# Patient Record
Sex: Male | Born: 2000
Health system: Southern US, Community
[De-identification: ages and names within clinical notes are randomized; demographics above are authoritative.]

## PROBLEM LIST (undated history)

## (undated) DIAGNOSIS — K219 Gastro-esophageal reflux disease without esophagitis: Secondary | ICD-10-CM

## (undated) DIAGNOSIS — F909 Attention-deficit hyperactivity disorder, unspecified type: Secondary | ICD-10-CM

## (undated) DIAGNOSIS — J45909 Unspecified asthma, uncomplicated: Secondary | ICD-10-CM

## (undated) HISTORY — DX: Unspecified asthma, uncomplicated: J45.909

## (undated) HISTORY — DX: Gastro-esophageal reflux disease without esophagitis: K21.9

## (undated) HISTORY — DX: Attention-deficit hyperactivity disorder, unspecified type: F90.9

---
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
## 2018-01-08 ENCOUNTER — Telehealth: Payer: Self-pay | Admitting: *Deleted

## 2018-01-08 NOTE — Telephone Encounter (Signed)
Copied from [place] [id number]. Topic: General - Other >> Jan 08, 2018  2:44 PM Keene Breath wrote: Reason for CRM: Patient called to see if he could possibly be worked in for an appointment to review his medications, VYVANSE 70 MG capsule.  He will be all out in a few days and he really needs to get an appointment before he runs out.  Please advise.  CB# [phone].

## 2018-01-08 NOTE — Telephone Encounter (Signed)
I do not see any openings on the schedule this week

## 2018-01-08 NOTE — Telephone Encounter (Signed)
He can be placed in 4:30 slot next week.

## 2018-01-09 NOTE — Telephone Encounter (Signed)
Patient is scheduled   

## 2018-01-15 ENCOUNTER — Ambulatory Visit (INDEPENDENT_AMBULATORY_CARE_PROVIDER_SITE_OTHER): Payer: BLUE CROSS/BLUE SHIELD

## 2018-01-15 ENCOUNTER — Ambulatory Visit: Payer: BLUE CROSS/BLUE SHIELD | Admitting: Family Medicine

## 2018-01-15 ENCOUNTER — Encounter: Payer: Self-pay | Admitting: Family Medicine

## 2018-01-15 VITALS — BP 122/76 | HR 94 | Temp 98.3°F | Wt 194.8 lb

## 2018-01-15 DIAGNOSIS — R053 Chronic cough: Secondary | ICD-10-CM

## 2018-01-15 DIAGNOSIS — F909 Attention-deficit hyperactivity disorder, unspecified type: Secondary | ICD-10-CM

## 2018-01-15 DIAGNOSIS — F419 Anxiety disorder, unspecified: Secondary | ICD-10-CM

## 2018-01-15 DIAGNOSIS — R21 Rash and other nonspecific skin eruption: Secondary | ICD-10-CM | POA: Diagnosis not present

## 2018-01-15 DIAGNOSIS — T300 Burn of unspecified body region, unspecified degree: Secondary | ICD-10-CM

## 2018-01-15 DIAGNOSIS — R05 Cough: Secondary | ICD-10-CM

## 2018-01-15 DIAGNOSIS — S39012A Strain of muscle, fascia and tendon of lower back, initial encounter: Secondary | ICD-10-CM | POA: Diagnosis not present

## 2018-01-15 MED ORDER — VYVANSE 70 MG PO CAPS: 70.0000 mg | ORAL_CAPSULE | Freq: Every day | ORAL | 0 refills | Status: DC

## 2018-01-15 MED ORDER — TRIAMCINOLONE ACETONIDE 0.1 % EX CREA: 1.0000 "application " | TOPICAL_CREAM | Freq: Two times a day (BID) | CUTANEOUS | 0 refills | Status: DC

## 2018-01-15 NOTE — Patient Instructions (Addendum)
Nice to see you. Please try the triamcinolone for the rash. Please do the back exercises for your back. We will contact you with the chest x-ray result. You need to wear a mask when dealing with chemicals.  If you inhale chemicals or come in contact with chemicals in the future you need to call poison control.   Back Exercises If you have pain in your back, do these exercises 2-3 times each day or as told by your doctor. When the pain goes away, do the exercises once each day, but repeat the steps more times for each exercise (do more repetitions). If you do not have pain in your back, do these exercises once each day or as told by your doctor. Exercises Single Knee to Chest  Do these steps 3-5 times in a row for each leg: 1. Lie on your back on a firm bed or the floor with your legs stretched out. 2. Bring one knee to your chest. 3. Hold your knee to your chest by grabbing your knee or thigh. 4. Pull on your knee until you feel a gentle stretch in your lower back. 5. Keep doing the stretch for 10-30 seconds. 6. Slowly let go of your leg and straighten it.  Pelvic Tilt  Do these steps 5-10 times in a row: 1. Lie on your back on a firm bed or the floor with your legs stretched out. 2. Bend your knees so they point up to the ceiling. Your feet should be flat on the floor. 3. Tighten your lower belly (abdomen) muscles to press your lower back against the floor. This will make your tailbone point up to the ceiling instead of pointing down to your feet or the floor. 4. Stay in this position for 5-10 seconds while you gently tighten your muscles and breathe evenly.  Cat-Cow  Do these steps until your lower back bends more easily: 1. Get on your hands and knees on a firm surface. Keep your hands under your shoulders, and keep your knees under your hips. You may put padding under your knees. 2. Let your head hang down, and make your tailbone point down to the floor so your lower back is round  like the back of a cat. 3. Stay in this position for 5 seconds. 4. Slowly lift your head and make your tailbone point up to the ceiling so your back hangs low (sags) like the back of a cow. 5. Stay in this position for 5 seconds.  Press-Ups  Do these steps 5-10 times in a row: 1. Lie on your belly (face-down) on the floor. 2. Place your hands near your head, about shoulder-width apart. 3. While you keep your back relaxed and keep your hips on the floor, slowly straighten your arms to raise the top half of your body and lift your shoulders. Do not use your back muscles. To make yourself more comfortable, you may change where you place your hands. 4. Stay in this position for 5 seconds. 5. Slowly return to lying flat on the floor.  Bridges  Do these steps 10 times in a row: 1. Lie on your back on a firm surface. 2. Bend your knees so they point up to the ceiling. Your feet should be flat on the floor. 3. Tighten your butt muscles and lift your butt off of the floor until your waist is almost as high as your knees. If you do not feel the muscles working in your butt and the back of  your thighs, slide your feet 1-2 inches farther away from your butt. 4. Stay in this position for 3-5 seconds. 5. Slowly lower your butt to the floor, and let your butt muscles relax.  If this exercise is too easy, try doing it with your arms crossed over your chest. Belly Crunches  Do these steps 5-10 times in a row: 1. Lie on your back on a firm bed or the floor with your legs stretched out. 2. Bend your knees so they point up to the ceiling. Your feet should be flat on the floor. 3. Cross your arms over your chest. 4. Tip your chin a little bit toward your chest but do not bend your neck. 5. Tighten your belly muscles and slowly raise your chest just enough to lift your shoulder blades a tiny bit off of the floor. 6. Slowly lower your chest and your head to the floor.  Back Lifts Do these steps 5-10  times in a row: 1. Lie on your belly (face-down) with your arms at your sides, and rest your forehead on the floor. 2. Tighten the muscles in your legs and your butt. 3. Slowly lift your chest off of the floor while you keep your hips on the floor. Keep the back of your head in line with the curve in your back. Look at the floor while you do this. 4. Stay in this position for 3-5 seconds. 5. Slowly lower your chest and your face to the floor.  Contact a doctor if:  Your back pain gets a lot worse when you do an exercise.  Your back pain does not lessen 2 hours after you exercise. If you have any of these problems, stop doing the exercises. Do not do them again unless your doctor says it is okay. Get help right away if:  You have sudden, very bad back pain. If this happens, stop doing the exercises. Do not do them again unless your doctor says it is okay. This information is not intended to replace advice given to you by your health care provider. Make sure you discuss any questions you have with your health care provider. Document Released: 07/23/2010 Document Revised: 11/26/2015 Document Reviewed: 08/14/2014 Elsevier Interactive Patient Education  [employer].

## 2018-01-16 DIAGNOSIS — S39012A Strain of muscle, fascia and tendon of lower back, initial encounter: Secondary | ICD-10-CM | POA: Insufficient documentation

## 2018-01-16 DIAGNOSIS — T300 Burn of unspecified body region, unspecified degree: Secondary | ICD-10-CM | POA: Insufficient documentation

## 2018-01-16 DIAGNOSIS — R053 Chronic cough: Secondary | ICD-10-CM | POA: Insufficient documentation

## 2018-01-16 DIAGNOSIS — R05 Cough: Secondary | ICD-10-CM | POA: Insufficient documentation

## 2018-01-16 DIAGNOSIS — F419 Anxiety disorder, unspecified: Secondary | ICD-10-CM | POA: Insufficient documentation

## 2018-01-16 NOTE — Assessment & Plan Note (Signed)
Stable.  Refill Vyvanse.

## 2018-01-16 NOTE — Assessment & Plan Note (Addendum)
I suspect that allergies with postnasal drip are contributing.  Vaping is also likely contributing.  We will trial Flonase.  Will obtain a chest x-ray given possible inhalation exposure.  I advised him to wear a mask when he is around any kind of chemicals that he might inhale.  I advised that he contact poison control if he inhales any chemicals.  I advised to quit vaping.  If not improving would refer to pulmonology.

## 2018-01-16 NOTE — Assessment & Plan Note (Signed)
Offered referral for therapy though patient declined.  He opted to monitor his symptoms.

## 2018-01-16 NOTE — Assessment & Plan Note (Signed)
Nonspecific.  Discussed trial of triamcinolone.  If not improving he will let us know.
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
## 2018-01-16 NOTE — Assessment & Plan Note (Signed)
Muscular strain.  Benign exam.  Neurologically intact.  Given exercises to complete.

## 2018-02-14 ENCOUNTER — Telehealth: Payer: Self-pay | Admitting: Family Medicine

## 2018-02-14 NOTE — Telephone Encounter (Signed)
Copied from [place] [id number]. Topic: Quick Communication - Rx Refill/Question >> Feb 14, 2018 12:36 PM Margot Ables wrote: Medication: vyvanse - pt is out of his medication and stating the pharmacy will not fill until 02/15/18. He said last fill was 01/15/18 for 30 days but there are 31 days in July. He said with the paper RXs he used to get he could fill 3 days early before running out. Pt states pharmacy advised need MD approval to fill early. Please advise.  [facility] [place], [facility] [address] Shari Prows [phone] (Phone) [phone] (Fax)

## 2018-02-14 NOTE — Telephone Encounter (Signed)
Please advise 

## 2018-02-14 NOTE — Telephone Encounter (Signed)
It is fine to fill early. Thanks.

## 2018-02-14 NOTE — Telephone Encounter (Signed)
Notified pharmacist of approval for early fill

## 2018-03-14 ENCOUNTER — Other Ambulatory Visit: Payer: Self-pay | Admitting: Family Medicine

## 2018-03-14 MED ORDER — VYVANSE 70 MG PO CAPS: 70.0000 mg | ORAL_CAPSULE | Freq: Every day | ORAL | 0 refills | Status: DC

## 2018-03-14 NOTE — Telephone Encounter (Signed)
Copied from [place] [id number]. Topic: Quick Communication - See Telephone Encounter >> Mar 14, 2018  2:12 PM Bea Graff, NT wrote: CRM for notification. See Telephone encounter for: 03/14/18. Pt calling and states the pharmacy is unable to fille his vyvanse until 9/15 and he only has 2 pills left. He states he will be leaving town on Friday. Please advise. [facility] STORE [place], [facility] [address] Shari Prows [phone] (Phone) [phone] (Fax)

## 2018-03-14 NOTE — Telephone Encounter (Signed)
Call pt    I spoke with [facility] and no current refills on file  I refilled out of courtesy for patient     Last fill 02/14/18 I looked up patient on [place] Controlled Substances Reporting System and saw no activity that raised concern of inappropriate use.

## 2018-03-14 NOTE — Telephone Encounter (Signed)
Request early refill for vyvanse. Dr.Sonnenberg patient

## 2018-03-15 NOTE — Telephone Encounter (Signed)
Pt given message from [place].

## 2018-03-15 NOTE — Telephone Encounter (Signed)
Left voice mail for patient to call back ok for [facility] to speak to patient    

## 2018-04-04 ENCOUNTER — Encounter: Payer: Self-pay | Admitting: Family Medicine

## 2018-04-04 ENCOUNTER — Ambulatory Visit: Payer: BLUE CROSS/BLUE SHIELD | Admitting: Family Medicine

## 2018-04-04 VITALS — BP 116/74 | HR 88 | Temp 98.4°F | Ht 69.0 in | Wt 204.6 lb

## 2018-04-04 DIAGNOSIS — Z789 Other specified health status: Secondary | ICD-10-CM

## 2018-04-04 DIAGNOSIS — R070 Pain in throat: Secondary | ICD-10-CM | POA: Diagnosis not present

## 2018-04-04 DIAGNOSIS — B370 Candidal stomatitis: Secondary | ICD-10-CM | POA: Diagnosis not present

## 2018-04-04 DIAGNOSIS — J029 Acute pharyngitis, unspecified: Secondary | ICD-10-CM | POA: Diagnosis not present

## 2018-04-04 DIAGNOSIS — R0982 Postnasal drip: Secondary | ICD-10-CM | POA: Diagnosis not present

## 2018-04-04 LAB — POCT RAPID STREP A (OFFICE): Rapid Strep A Screen: NEGATIVE

## 2018-04-04 MED ORDER — NYSTATIN 100000 UNIT/ML MT SUSP: 5.0000 mL | Freq: Four times a day (QID) | OROMUCOSAL | 0 refills | Status: AC

## 2018-04-04 MED ORDER — AMOXICILLIN 500 MG PO CAPS: 500.0000 mg | ORAL_CAPSULE | Freq: Two times a day (BID) | ORAL | 0 refills | Status: DC

## 2018-04-04 MED ORDER — FLUTICASONE PROPIONATE 50 MCG/ACT NA SUSP: 2.0000 | Freq: Every day | NASAL | 6 refills | Status: AC

## 2018-04-04 NOTE — Addendum Note (Signed)
Addended by: Neta Ehlers on: 04/04/2018 10:42 AM   Modules accepted: Orders
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
## 2018-04-04 NOTE — Patient Instructions (Addendum)
Quit Vaping!   Strep Throat Strep throat is an infection of the throat. It is caused by germs. Strep throat spreads from person to person because of coughing, sneezing, or close contact. Follow these instructions at home: Medicines  Take over-the-counter and prescription medicines only as told by your doctor.  Take your antibiotic medicine as told by your doctor. Do not stop taking the medicine even if you feel better.  Have family members who also have a sore throat or fever go to a doctor. Eating and drinking  Do not share food, drinking cups, or personal items.  Try eating soft foods until your sore throat feels better.  Drink enough fluid to keep your pee (urine) clear or pale yellow. General instructions  Rinse your mouth (gargle) with a salt-water mixture 3-4 times per day or as needed. To make a salt-water mixture, stir -1 tsp of salt into 1 cup of warm water.  Make sure that all people in your house wash their hands well.  Rest.  Stay home from school or work until you have been taking antibiotics for 24 hours.  Keep all follow-up visits as told by your doctor. This is important. Contact a doctor if:  Your neck keeps getting bigger.  You get a rash, cough, or earache.  You cough up thick liquid that is green, yellow-brown, or bloody.  You have pain that does not get better with medicine.  Your problems get worse instead of getting better.  You have a fever. Get help right away if:  You throw up (vomit).  You get a very bad headache.  You neck hurts or it feels stiff.  You have chest pain or you are short of breath.  You have drooling, very bad throat pain, or changes in your voice.  Your neck is swollen or the skin gets red and tender.  Your mouth is dry or you are peeing less than normal.  You keep feeling more tired or it is hard to wake up.  Your joints are red or they hurt. This information is not intended to replace advice given to you by  your health care provider. Make sure you discuss any questions you have with your health care provider. Document Released: 12/07/2007 Document Revised: 02/17/2016 Document Reviewed: 10/13/2014 Elsevier Interactive Patient Education  [employer].

## 2018-04-06 LAB — UPPER RESPIRATORY CULTURE, ROUTINE

## 2018-04-30 ENCOUNTER — Ambulatory Visit: Payer: BLUE CROSS/BLUE SHIELD | Admitting: Family Medicine

## 2018-04-30 ENCOUNTER — Encounter: Payer: Self-pay | Admitting: Family Medicine

## 2018-04-30 VITALS — BP 118/72 | HR 97 | Temp 97.9°F | Ht 69.0 in | Wt 208.0 lb

## 2018-04-30 DIAGNOSIS — J309 Allergic rhinitis, unspecified: Secondary | ICD-10-CM

## 2018-04-30 DIAGNOSIS — F909 Attention-deficit hyperactivity disorder, unspecified type: Secondary | ICD-10-CM | POA: Diagnosis not present

## 2018-04-30 DIAGNOSIS — Z114 Encounter for screening for human immunodeficiency virus [HIV]: Secondary | ICD-10-CM | POA: Diagnosis not present

## 2018-04-30 DIAGNOSIS — Z23 Encounter for immunization: Secondary | ICD-10-CM | POA: Diagnosis not present

## 2018-04-30 DIAGNOSIS — K219 Gastro-esophageal reflux disease without esophagitis: Secondary | ICD-10-CM | POA: Insufficient documentation

## 2018-04-30 DIAGNOSIS — J029 Acute pharyngitis, unspecified: Secondary | ICD-10-CM | POA: Insufficient documentation

## 2018-04-30 DIAGNOSIS — B370 Candidal stomatitis: Secondary | ICD-10-CM

## 2018-04-30 MED ORDER — LISDEXAMFETAMINE DIMESYLATE 70 MG PO CAPS: 70.0000 mg | ORAL_CAPSULE | Freq: Every day | ORAL | 0 refills | Status: DC

## 2018-04-30 MED ORDER — NYSTATIN 100000 UNIT/ML MT SUSP: 5.0000 mL | Freq: Four times a day (QID) | OROMUCOSAL | 0 refills | Status: DC

## 2018-04-30 MED ORDER — VYVANSE 70 MG PO CAPS: 70.0000 mg | ORAL_CAPSULE | Freq: Every day | ORAL | 0 refills | Status: DC

## 2018-04-30 MED ORDER — OMEPRAZOLE 20 MG PO CPDR: 20.0000 mg | DELAYED_RELEASE_CAPSULE | ORAL | 1 refills | Status: DC

## 2018-04-30 NOTE — Assessment & Plan Note (Signed)
Continue current regimen

## 2018-04-30 NOTE — Assessment & Plan Note (Signed)
Continue every other day prilosec.

## 2018-04-30 NOTE — Patient Instructions (Signed)
Nice to see you. Please try the nystatin.  We will check lab work today and contact you with the results.  I will see you back in several weeks for recheck.

## 2018-04-30 NOTE — Assessment & Plan Note (Signed)
Continued Vyvanse.  Discussed appropriately taking this in the morning.

## 2018-04-30 NOTE — Progress Notes (Signed)
[name], MD Phone: [phone]  Cory Harding is a 17 y.o. male who presents today for follow-up.  CC: ADHD, strep throat, thrush, postnasal drip, reflux  ADHD: Taking Vyvanse daily.  If he takes it around noon he has trouble sleeping.  No appetite suppression or palpitations.  He was treated for strep throat and thrush recently.  Strep culture was positive.  He shows me a picture today of erythematous soft palate with white dots.  He notes his tongue was covered in white spots as well.  He completed amoxicillin and nystatin.  He notes his symptoms have improved.  He notes no prior antibiotic exposure or steroid exposure.  No inhaler use though he does vape.  He has decreased his vaping.  He does note his girlfriend had a yeast infection which may have contributed to the thrush.  No penile discharge or dysuria.  He does report postnasal drip with season changes.  He uses Zyrtec for that with some benefit.  He has decreased his vaping.  Notes taking omeprazole every other day.  This helps control his reflux.  He has no burning or sour taste.  No blood in his stool.  Social History   Tobacco Use  Smoking Status Never Smoker  Smokeless Tobacco Never Used     ROS see history of present illness  Objective  Physical Exam Vitals:   04/30/18 0952  BP: 118/72  Pulse: 97  Temp: 97.9 F (36.6 C)  SpO2: 98%    BP Readings from Last 3 Encounters:  04/30/18 118/72  04/04/18 116/74  01/15/18 122/76   Wt Readings from Last 3 Encounters:  04/30/18 208 lb (94.3 kg) (95 %, Z= 1.64)*  04/04/18 204 lb 9.6 oz (92.8 kg) (94 %, Z= 1.57)*  01/15/18 194 lb 12.8 oz (88.4 kg) (91 %, Z= 1.36)*   * Growth percentiles are based on CDC (Boys, 2-20 Years) data.    Physical Exam  Constitutional: No distress.  HENT:  Head: Normocephalic and atraumatic.  Erythematous soft palate with no exudate, normal tonsils with no exudate, normal posterior oropharynx  Cardiovascular: Normal rate,  regular rhythm and normal heart sounds.  Pulmonary/Chest: Effort normal and breath sounds normal.  Abdominal: Soft. Bowel sounds are normal. He exhibits no distension. There is no tenderness. There is no rebound and no guarding.  Musculoskeletal: He exhibits no edema.  Neurological: He is alert.  Skin: Skin is warm and dry. He is not diaphoretic.     Assessment/Plan: Please see individual problem list.  Attention deficit hyperactivity disorder (ADHD) Continued Vyvanse.  Discussed appropriately taking this in the morning.  Thrush Question whether or not this was thrush versus strep throat.  It has improved though he does still have some erythema in his soft palate.  We will retreat with nystatin.  He is not having any symptoms to indicate strep throat.  We will screen for HIV though his thrush is likely related to his vaping use.  Encouraged cessation of vaping.  GERD (gastroesophageal reflux disease) Continue every other day prilosec.   Allergic rhinitis Continue current regimen.   Health Maintenance: HIV screening today.  Orders Placed This Encounter  Procedures  . Flu Vaccine QUAD 6+ mos PF IM (Fluarix Quad PF)  . HIV antibody (with reflex)    Meds ordered this encounter  Medications  . nystatin (MYCOSTATIN) 100000 UNIT/ML suspension    Sig: Take 5 mLs (500,000 Units total) by mouth 4 (four) times daily.    Dispense:  60 mL  Refill:  0  . omeprazole (PRILOSEC) 20 MG capsule    Sig: Take 1 capsule (20 mg total) by mouth every other day.    Dispense:  45 capsule    Refill:  1  . VYVANSE 70 MG capsule    Sig: Take 1 capsule (70 mg total) by mouth daily.    Dispense:  30 capsule    Refill:  0  . lisdexamfetamine (VYVANSE) 70 MG capsule    Sig: Take 1 capsule (70 mg total) by mouth daily.    Dispense:  30 capsule    Refill:  0  . lisdexamfetamine (VYVANSE) 70 MG capsule    Sig: Take 1 capsule (70 mg total) by mouth daily.    Dispense:  30 capsule    Refill:  0      [name], MD [place]

## 2018-04-30 NOTE — Assessment & Plan Note (Addendum)
Question whether or not this was thrush versus strep throat.  It has improved though he does still have some erythema in his soft palate.  We will retreat with nystatin.  He is not having any symptoms to indicate strep throat.  We will screen for HIV though his thrush is likely related to his vaping use.  Encouraged cessation of vaping.

## 2018-05-01 ENCOUNTER — Telehealth: Payer: Self-pay | Admitting: Family Medicine

## 2018-05-01 LAB — HIV ANTIBODY (ROUTINE TESTING W REFLEX): HIV: NONREACTIVE

## 2018-05-01 NOTE — Telephone Encounter (Signed)
Copied from [place] [id number]. Topic: Quick Communication - Lab Results (Clinic Use ONLY) >> May 01, 2018  3:23 PM Scherrie Gerlach wrote: Pt calling back for lab results from yesterday

## 2018-05-01 NOTE — Telephone Encounter (Signed)
Called and spoke with pt. Pt has been advised of his lab results.

## 2018-05-14 ENCOUNTER — Telehealth: Payer: Self-pay

## 2018-05-14 ENCOUNTER — Ambulatory Visit: Payer: BLUE CROSS/BLUE SHIELD | Admitting: Family Medicine

## 2018-05-14 NOTE — Telephone Encounter (Signed)
Copied from [place] [id number]. Topic: Appointment Scheduling - Scheduling Inquiry for Clinic >> May 14, 2018 11:31 AM Bea Graff, NT wrote: Reason for CRM: Pt cannot come in today due to another appt. HE would like to see if he can be worked in with someone else this week? Please advise.

## 2018-05-14 NOTE — Telephone Encounter (Signed)
Okay to schedule at 3:15 on Wednesday.

## 2018-05-14 NOTE — Telephone Encounter (Signed)
Sent to PCP please advise if we can see patient sooner.   Thanks   Last OV was in October for ADHD and thrush

## 2018-05-15 NOTE — Telephone Encounter (Signed)
Called patient and left a detailed VM for patient to call me back directly at [phone].

## 2018-05-16 NOTE — Telephone Encounter (Signed)
Called patient and left a VM to call back.  

## 2018-05-17 NOTE — Telephone Encounter (Signed)
Could not get in touch with patient and since Wednesday has past when would you like to see patient?

## 2018-05-17 NOTE — Telephone Encounter (Signed)
Someone else has been scheduled for this appt already

## 2018-05-17 NOTE — Telephone Encounter (Signed)
315 on 05/18/2018 would be okay.

## 2018-05-17 NOTE — Telephone Encounter (Signed)
1 day next week at 430 would be okay.

## 2018-05-18 ENCOUNTER — Telehealth: Payer: Self-pay

## 2018-05-18 NOTE — Telephone Encounter (Signed)
Patient scheduled for Monday at 430.

## 2018-05-18 NOTE — Telephone Encounter (Signed)
Copied from [place] [id number]. Topic: Appointment Scheduling - Scheduling Inquiry for Clinic >> May 18, 2018  2:06 PM Burchel, Abbi R wrote: Pt needs f/up for thrush.  Please call pt to sched, [phone].

## 2018-05-21 ENCOUNTER — Ambulatory Visit: Payer: BLUE CROSS/BLUE SHIELD | Admitting: Family Medicine

## 2018-05-21 ENCOUNTER — Encounter: Payer: Self-pay | Admitting: Family Medicine

## 2018-05-21 DIAGNOSIS — J029 Acute pharyngitis, unspecified: Secondary | ICD-10-CM | POA: Diagnosis not present

## 2018-05-21 DIAGNOSIS — F909 Attention-deficit hyperactivity disorder, unspecified type: Secondary | ICD-10-CM | POA: Diagnosis not present

## 2018-05-21 DIAGNOSIS — F419 Anxiety disorder, unspecified: Secondary | ICD-10-CM

## 2018-05-21 MED ORDER — BUSPIRONE HCL 5 MG PO TABS: 5.0000 mg | ORAL_TABLET | Freq: Two times a day (BID) | ORAL | 2 refills | Status: DC

## 2018-05-21 NOTE — Progress Notes (Signed)
  [name], MD Phone: [phone]  Cory Harding is a 17 y.o. male who presents today for follow-up.  CC: Sore throat, ADHD, anxiety  Sore throat: Patient previously seen for possible thrush.  He notes he started to feel sick with an upper respiratory illness shortly after seeing me.  Notes he thinks it was related to that.  His throat got a little irritated 2 days ago though he feels better now.  He is not vaping as much.  ADHD: Taking Vyvanse.  No palpitations or appetite suppression.  He does note some sleep difficulty though that seems to be related to the anxiety.  He takes his Vyvanse at 8 AM.  Anxiety: Patient notes there are times at night where he cannot get his mind to shut off.  He does feel anxious about things.  This has been an ongoing issue.  He has an interview tonight and is fairly anxious about it.  He does take a pre-workout mix that has 300 mg of caffeine in it.  He works out at night.  Social History   Tobacco Use  Smoking Status Never Smoker  Smokeless Tobacco Never Used     ROS see history of present illness  Objective  Physical Exam Vitals:   05/21/18 1632  BP: 138/90  Pulse: 72  Temp: 98.9 F (37.2 C)  SpO2: 96%    BP Readings from Last 3 Encounters:  05/21/18 138/90  04/30/18 118/72  04/04/18 116/74   Wt Readings from Last 3 Encounters:  05/21/18 205 lb 3.2 oz (93.1 kg) (94 %, Z= 1.58)*  04/30/18 208 lb (94.3 kg) (95 %, Z= 1.64)*  04/04/18 204 lb 9.6 oz (92.8 kg) (94 %, Z= 1.57)*   * Growth percentiles are based on CDC (Boys, 2-20 Years) data.    Physical Exam  Constitutional: No distress.  HENT:  Minimal posterior oropharynx erythema, no exudate  Cardiovascular: Normal rate, regular rhythm and normal heart sounds.  Pulmonary/Chest: Effort normal and breath sounds normal.  Musculoskeletal: He exhibits no edema.  Neurological: He is alert.  Skin: Skin is warm and dry. He is not diaphoretic.     Assessment/Plan: Please see  individual problem list.  Sore throat I suspect this is related to an upper respiratory infection.  He notes his symptoms have progressively improved.  He will monitor.  He will continue to work on quitting vaping.  Anxiety Patient with persistent anxiety symptoms.  Discussed options for medication treatment versus therapy referral.  Patient opted for medication management.  He wanted to avoid SSRIs given potential weight gain.  We opted for BuSpar.  He will follow-up in 3 months.  Attention deficit hyperactivity disorder (ADHD) Adequately controlled.  I suspect his sleep issues are more related to his anxiety.  He is going to be potentially starting a new job where he works 7 PM to 6 AM.  Discussed taking his Vyvanse to coincide with when he will be working.   No orders of the defined types were placed in this encounter.   Meds ordered this encounter  Medications  . busPIRone (BUSPAR) 5 MG tablet    Sig: Take 1 tablet (5 mg total) by mouth 2 (two) times daily.    Dispense:  60 tablet    Refill:  2     [name], MD [place]

## 2018-05-21 NOTE — Assessment & Plan Note (Signed)
Adequately controlled.  I suspect his sleep issues are more related to his anxiety.  He is going to be potentially starting a new job where he works 7 PM to 6 AM.  Discussed taking his Vyvanse to coincide with when he will be working.

## 2018-05-21 NOTE — Patient Instructions (Signed)
Nice to see you. We will place you on BuSpar for anxiety. Please switch your Vyvanse to evening if you get the overnight job.

## 2018-05-21 NOTE — Telephone Encounter (Signed)
Pt has been scheduled for an appt today.

## 2018-05-21 NOTE — Assessment & Plan Note (Signed)
Patient with persistent anxiety symptoms.  Discussed options for medication treatment versus therapy referral.  Patient opted for medication management.  He wanted to avoid SSRIs given potential weight gain.  We opted for BuSpar.  He will follow-up in 3 months.

## 2018-05-21 NOTE — Assessment & Plan Note (Signed)
I suspect this is related to an upper respiratory infection.  He notes his symptoms have progressively improved.  He will monitor.  He will continue to work on quitting vaping.

## 2018-06-25 ENCOUNTER — Telehealth: Payer: Self-pay | Admitting: *Deleted

## 2018-06-25 ENCOUNTER — Other Ambulatory Visit: Payer: Self-pay | Admitting: Internal Medicine

## 2018-06-25 DIAGNOSIS — F909 Attention-deficit hyperactivity disorder, unspecified type: Secondary | ICD-10-CM

## 2018-06-25 MED ORDER — LISDEXAMFETAMINE DIMESYLATE 70 MG PO CAPS: 70.0000 mg | ORAL_CAPSULE | Freq: Every day | ORAL | 0 refills | Status: DC

## 2018-06-25 NOTE — Telephone Encounter (Signed)
Copied from [place] [phone]. Topic: General - Other >> Jun 25, 2018 11:15 AM Yvette Rack wrote: Reason for CRM: Pt mother stated pt went to pick up Rx from [facility] in [place] but they do not have the VYVANSE 70 MG capsule. Pt mother requests that a new Rx for VYVANSE 70 MG capsule be sent to [id number] Lorina Rabon, [place]  [phone] (Phone)  [phone] (Fax)

## 2018-06-25 NOTE — Telephone Encounter (Signed)
Called [facility] in [place] and the do NOT have the 70 MG capsule of Vyvanse in stock. They suggested to cancel the Rx for December and the will keep the Rx for January on HOLD.   Sent to [facility] covering provider for PCP due to being out of the office. Please resent Vyvanse 70 MG 1 month supple for December to [facility] in [place].   Thanks

## 2018-06-25 NOTE — Telephone Encounter (Signed)
Sent 1 month supply will have PCP do 07/2018 when returns  [place]

## 2018-07-31 ENCOUNTER — Encounter: Payer: Self-pay | Admitting: Family Medicine

## 2018-07-31 ENCOUNTER — Ambulatory Visit: Payer: BLUE CROSS/BLUE SHIELD | Admitting: Family Medicine

## 2018-07-31 DIAGNOSIS — K219 Gastro-esophageal reflux disease without esophagitis: Secondary | ICD-10-CM

## 2018-07-31 DIAGNOSIS — F909 Attention-deficit hyperactivity disorder, unspecified type: Secondary | ICD-10-CM

## 2018-07-31 DIAGNOSIS — F419 Anxiety disorder, unspecified: Secondary | ICD-10-CM | POA: Diagnosis not present

## 2018-07-31 MED ORDER — LISDEXAMFETAMINE DIMESYLATE 70 MG PO CAPS: 70.0000 mg | ORAL_CAPSULE | Freq: Every day | ORAL | 0 refills | Status: DC

## 2018-07-31 MED ORDER — BUSPIRONE HCL 5 MG PO TABS: 5.0000 mg | ORAL_TABLET | Freq: Two times a day (BID) | ORAL | 2 refills | Status: DC

## 2018-07-31 NOTE — Assessment & Plan Note (Signed)
Well-controlled.  Continue Vyvanse.

## 2018-07-31 NOTE — Assessment & Plan Note (Signed)
Continue Prilosec.  Refer to GI given persistent symptoms.

## 2018-07-31 NOTE — Progress Notes (Signed)
  [name], MD Phone: [phone]  Cory Harding is a 18 y.o. male who presents today for follow-up.  CC: ADHD, anxiety  ADHD: Taking Vyvanse.  He is taking it right before going to work at 4:30 PM.  He notes no palpitations, appetite suppression, or sleep difficulty.  Notes that Vyvanse is effective.  Anxiety: He notes no anxiety or depression.  The BuSpar has been quite beneficial.  GERD: He continues on omeprazole.  Notes if he misses a dose he will have fairly significant symptoms of reflux with burning.  He notes no abdominal pain, blood in stool, or dysphagia.  Social History   Tobacco Use  Smoking Status Never Smoker  Smokeless Tobacco Never Used     ROS see history of present illness  Objective  Physical Exam Vitals:   07/31/18 1440  BP: 102/78  Pulse: 86  Temp: 97.8 F (36.6 C)  SpO2: 97%    BP Readings from Last 3 Encounters:  07/31/18 102/78  05/21/18 138/90  04/30/18 118/72   Wt Readings from Last 3 Encounters:  07/31/18 196 lb 9.6 oz (89.2 kg) (91 %, Z= 1.35)*  05/21/18 205 lb 3.2 oz (93.1 kg) (94 %, Z= 1.58)*  04/30/18 208 lb (94.3 kg) (95 %, Z= 1.64)*   * Growth percentiles are based on CDC (Boys, 2-20 Years) data.    Physical Exam Constitutional:      General: He is not in acute distress.    Appearance: He is not diaphoretic.  Cardiovascular:     Rate and Rhythm: Normal rate and regular rhythm.     Heart sounds: Normal heart sounds.  Pulmonary:     Effort: Pulmonary effort is normal.     Breath sounds: Normal breath sounds.  Musculoskeletal:     Right lower leg: No edema.     Left lower leg: No edema.  Skin:    General: Skin is warm and dry.  Neurological:     Mental Status: He is alert.      Assessment/Plan: Please see individual problem list.  GERD (gastroesophageal reflux disease) Continue Prilosec.  Refer to GI given persistent symptoms.  Anxiety Improved.  Continue current regimen.  Attention deficit  hyperactivity disorder (ADHD) Well-controlled.  Continue Vyvanse.   Orders Placed This Encounter  Procedures  . Ambulatory referral to Gastroenterology    Referral Priority:   Routine    Referral Type:   Consultation    Referral Reason:   Specialty Services Required    Number of Visits Requested:   1    Meds ordered this encounter  Medications  . lisdexamfetamine (VYVANSE) 70 MG capsule    Sig: Take 1 capsule (70 mg total) by mouth daily.    Dispense:  30 capsule    Refill:  0  . lisdexamfetamine (VYVANSE) 70 MG capsule    Sig: Take 1 capsule (70 mg total) by mouth daily.    Dispense:  30 capsule    Refill:  0  . lisdexamfetamine (VYVANSE) 70 MG capsule    Sig: Take 1 capsule (70 mg total) by mouth daily.    Dispense:  30 capsule    Refill:  0  . busPIRone (BUSPAR) 5 MG tablet    Sig: Take 1 tablet (5 mg total) by mouth 2 (two) times daily.    Dispense:  180 tablet    Refill:  2     [name], MD [place]

## 2018-07-31 NOTE — Assessment & Plan Note (Signed)
Improved.  Continue current regimen

## 2018-07-31 NOTE — Patient Instructions (Signed)
Nice to see you. Good luck with work. I have refilled your medications.

## 2018-08-06 ENCOUNTER — Encounter: Payer: Self-pay | Admitting: *Deleted

## 2018-08-23 ENCOUNTER — Encounter: Payer: Self-pay | Admitting: Internal Medicine

## 2018-08-23 ENCOUNTER — Ambulatory Visit: Payer: BLUE CROSS/BLUE SHIELD | Admitting: Internal Medicine

## 2018-08-23 VITALS — BP 112/76 | HR 77 | Temp 97.7°F | Ht 69.0 in | Wt 194.6 lb

## 2018-08-23 DIAGNOSIS — J029 Acute pharyngitis, unspecified: Secondary | ICD-10-CM | POA: Diagnosis not present

## 2018-08-23 LAB — POCT RAPID STREP A (OFFICE): Rapid Strep A Screen: NEGATIVE

## 2018-08-23 MED ORDER — CEFTRIAXONE SODIUM 250 MG IJ SOLR
250.0000 mg | Freq: Once | INTRAMUSCULAR | Status: AC
  Administered 2018-08-23: 250 mg via INTRAMUSCULAR

## 2018-08-23 MED ORDER — AZITHROMYCIN 250 MG PO TABS: ORAL_TABLET | ORAL | 0 refills | Status: DC

## 2018-08-23 NOTE — Progress Notes (Signed)
Pre visit review using our clinic review tool, if applicable. No additional management support is needed unless otherwise documented below in the visit note. 

## 2018-08-23 NOTE — Progress Notes (Signed)
Chief Complaint  Patient presents with  . Sore Throat   Sore throat rapid strep negative today, sore throat and fatigue x 2 days white exudate on back of throat yesterday nothing tried, no fever no body aches other than working out recently at the gym and sore He has been sexually active with oral sex recently with new partner x 1-2 weeks ago    Review of Systems  Constitutional: Negative for fever.  HENT: Positive for sore throat.   Respiratory: Negative for cough.   Cardiovascular: Negative for chest pain.  Skin: Negative for rash.   Past Medical History:  Diagnosis Date  . ADHD (attention deficit hyperactivity disorder)   . Asthma   . GERD (gastroesophageal reflux disease)    No past surgical history on file. Family History  Problem Relation Age of Onset  . Hypertension Maternal Grandfather   . Heart disease Maternal Grandfather   . Heart disease Paternal Grandfather   . Sudden death Paternal Grandfather   . Mental illness Paternal Grandfather   . Alcoholism Paternal Grandfather    Social History   Socioeconomic History  . Marital status: Single    Spouse name: Not on file  . Number of children: Not on file  . Years of education: Not on file  . Highest education level: Not on file  Occupational History  . Occupation: Ship broker  Social Needs  . Financial resource strain: Not on file  . Food insecurity:    Worry: Not on file    Inability: Not on file  . Transportation needs:    Medical: Not on file    Non-medical: Not on file  Tobacco Use  . Smoking status: Never Smoker  . Smokeless tobacco: Never Used  Substance and Sexual Activity  . Alcohol use: No    Alcohol/week: 0.0 standard drinks  . Drug use: No  . Sexual activity: Not on file  Lifestyle  . Physical activity:    Days per week: Not on file    Minutes per session: Not on file  . Stress: Not on file  Relationships  . Social connections:    Talks on phone: Not on file    Gets together: Not on file     Attends religious service: Not on file    Active member of club or organization: Not on file    Attends meetings of clubs or organizations: Not on file    Relationship status: Not on file  . Intimate partner violence:    Fear of current or ex partner: Not on file    Emotionally abused: Not on file    Physically abused: Not on file    Forced sexual activity: Not on file  Other Topics Concern  . Not on file  Social History Narrative  . Not on file   Current Meds  Medication Sig  . busPIRone (BUSPAR) 5 MG tablet Take 1 tablet (5 mg total) by mouth 2 (two) times daily.  . cetirizine (ZYRTEC) 10 MG tablet Take 10 mg by mouth daily.  . fluticasone (FLONASE) 50 MCG/ACT nasal spray Place 2 sprays into both nostrils daily.  Derrill Memo ON 09/29/2018] lisdexamfetamine (VYVANSE) 70 MG capsule Take 1 capsule (70 mg total) by mouth daily.  Derrill Memo ON 08/31/2018] lisdexamfetamine (VYVANSE) 70 MG capsule Take 1 capsule (70 mg total) by mouth daily.  Marland Kitchen lisdexamfetamine (VYVANSE) 70 MG capsule Take 1 capsule (70 mg total) by mouth daily.  Marland Kitchen nystatin (MYCOSTATIN) 100000 UNIT/ML suspension Take 5 mLs (500,000  Units total) by mouth 4 (four) times daily.  Marland Kitchen omeprazole (PRILOSEC) 20 MG capsule Take 1 capsule (20 mg total) by mouth every other day.   No Known Allergies No results found for this or any previous visit (from the past 2160 hour(s)). Objective  Body mass index is 28.74 kg/m. Wt Readings from Last 3 Encounters:  08/23/18 194 lb 9.6 oz (88.3 kg) (90 %, Z= 1.30)*  07/31/18 196 lb 9.6 oz (89.2 kg) (91 %, Z= 1.35)*  05/21/18 205 lb 3.2 oz (93.1 kg) (94 %, Z= 1.58)*   * Growth percentiles are based on CDC (Boys, 2-20 Years) data.   Temp Readings from Last 3 Encounters:  08/23/18 97.7 F (36.5 C) (Oral)  07/31/18 97.8 F (36.6 C) (Oral)  05/21/18 98.9 F (37.2 C) (Oral)   BP Readings from Last 3 Encounters:  08/23/18 112/76  07/31/18 102/78  05/21/18 138/90   Pulse Readings from Last  3 Encounters:  08/23/18 77  07/31/18 86  05/21/18 72    Physical Exam Vitals signs and nursing note reviewed.  Constitutional:      Appearance: Normal appearance. He is well-developed and well-groomed.  HENT:     Head: Normocephalic and atraumatic.     Mouth/Throat:     Mouth: Mucous membranes are moist.     Pharynx: Oropharyngeal exudate and posterior oropharyngeal erythema present.     Tonsils: Tonsillar exudate present. No tonsillar abscesses.     Comments: Exudate on tonsils  Eyes:     Conjunctiva/sclera: Conjunctivae normal.     Pupils: Pupils are equal, round, and reactive to light.  Cardiovascular:     Rate and Rhythm: Normal rate and regular rhythm.     Heart sounds: Normal heart sounds. No murmur.  Pulmonary:     Effort: Pulmonary effort is normal.     Breath sounds: Normal breath sounds.  Skin:    General: Skin is warm and dry.  Neurological:     General: No focal deficit present.     Mental Status: He is alert and oriented to person, place, and time.     Gait: Gait normal.  Psychiatric:        Attention and Perception: Attention and perception normal.        Mood and Affect: Mood and affect normal.        Speech: Speech normal.        Behavior: Behavior normal. Behavior is cooperative.        Thought Content: Thought content normal.        Cognition and Memory: Cognition and memory normal.        Judgment: Judgment normal.     Assessment   1. Sore throat rapid strep negative r/o GC Plan   2.  Rocephin 250 x 1 zpack 1 g today and 250 mg 1 pill x 4 days day 2-5  Warm salt gargles Cepacol/sucrets/chloroseptic spray  Throat culture for gonorrhea today  Rapid strep negative  If throat continues to bother refer to ENT Use hydrogen peroxide based mouthwash h/o tonsil stones   Provider: Dr. Olivia Mackie McLean-Scocuzza-Internal Medicine

## 2018-08-23 NOTE — Patient Instructions (Signed)
Call back if not better  rec warm salt water gargles and hydrogen peroxide based mouth wash for tonsil stones  If sore throat continues you will need referral to ENT    Sore Throat A sore throat is pain, burning, irritation, or scratchiness in the throat. When you have a sore throat, you may feel pain or tenderness in your throat when you swallow or talk. Many things can cause a sore throat, including:  An infection.  Seasonal allergies.  Dryness in the air.  Irritants, such as smoke or pollution.  Radiation treatment to the area.  Gastroesophageal reflux disease (GERD).  A tumor. A sore throat is often the first sign of another sickness. It may happen with other symptoms, such as coughing, sneezing, fever, and swollen neck glands. Most sore throats go away without medical treatment. Follow these instructions at home:      Take over-the-counter medicines only as told by your health care provider. ? If your child has a sore throat, do not give your child aspirin because of the association with Reye syndrome.  Drink enough fluids to keep your urine pale yellow.  Rest as needed.  To help with pain, try: ? Sipping warm liquids, such as broth, herbal tea, or warm water. ? Eating or drinking cold or frozen liquids, such as frozen ice pops. ? Gargling with a salt-water mixture 3-4 times a day or as needed. To make a salt-water mixture, completely dissolve -1 tsp (3-6 g) of salt in 1 cup (237 mL) of warm water. ? Sucking on hard candy or throat lozenges. ? Putting a cool-mist humidifier in your bedroom at night to moisten the air. ? Sitting in the bathroom with the door closed for 5-10 minutes while you run hot water in the shower.  Do not use any products that contain nicotine or tobacco, such as cigarettes, e-cigarettes, and chewing tobacco. If you need help quitting, ask your health care provider.  Wash your hands well and often with soap and water. If soap and water are not  available, use hand sanitizer. Contact a health care provider if:  You have a fever for more than 2-3 days.  You have symptoms that last (are persistent) for more than 2-3 days.  Your throat does not get better within 7 days.  You have a fever and your symptoms suddenly get worse.  Your child who is 3 months to 74 years old has a temperature of 102.53F (39C) or higher. Get help right away if:  You have difficulty breathing.  You cannot swallow fluids, soft foods, or your saliva.  You have increased swelling in your throat or neck.  You have persistent nausea and vomiting. Summary  A sore throat is pain, burning, irritation, or scratchiness in the throat. Many things can cause a sore throat.  Take over-the-counter medicines only as told by your health care provider. Do not give your child aspirin.  Drink plenty of fluids, and rest as needed.  Contact a health care provider if your symptoms worsen or your sore throat does not get better within 7 days. This information is not intended to replace advice given to you by your health care provider. Make sure you discuss any questions you have with your health care provider. Document Released: 07/28/2004 Document Revised: 11/20/2017 Document Reviewed: 11/20/2017 Elsevier Interactive Patient Education  2019 [employer].

## 2018-08-26 LAB — GC/CHLAMYDIA PROBE, AMP (THROAT)
Chlamydia trachomatis RNA: NOT DETECTED
Neisseria gonorrhoeae RNA: NOT DETECTED

## 2018-08-29 ENCOUNTER — Ambulatory Visit: Payer: BLUE CROSS/BLUE SHIELD | Admitting: Family Medicine

## 2018-08-29 ENCOUNTER — Encounter: Payer: Self-pay | Admitting: Family Medicine

## 2018-08-29 DIAGNOSIS — S161XXA Strain of muscle, fascia and tendon at neck level, initial encounter: Secondary | ICD-10-CM | POA: Diagnosis not present

## 2018-08-29 DIAGNOSIS — M25562 Pain in left knee: Secondary | ICD-10-CM | POA: Diagnosis not present

## 2018-08-29 DIAGNOSIS — S39012A Strain of muscle, fascia and tendon of lower back, initial encounter: Secondary | ICD-10-CM

## 2018-08-29 MED ORDER — CYCLOBENZAPRINE HCL 10 MG PO TABS: 5.0000 mg | ORAL_TABLET | Freq: Every evening | ORAL | 0 refills | Status: DC | PRN
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
## 2018-08-30 ENCOUNTER — Ambulatory Visit: Payer: BLUE CROSS/BLUE SHIELD | Admitting: Family Medicine

## 2018-11-05 ENCOUNTER — Other Ambulatory Visit: Payer: Self-pay

## 2018-11-05 ENCOUNTER — Telehealth: Payer: Self-pay | Admitting: Family Medicine

## 2018-11-05 ENCOUNTER — Encounter: Payer: Self-pay | Admitting: Family Medicine

## 2018-11-05 ENCOUNTER — Ambulatory Visit: Payer: BLUE CROSS/BLUE SHIELD | Admitting: Family Medicine

## 2018-11-05 ENCOUNTER — Ambulatory Visit (INDEPENDENT_AMBULATORY_CARE_PROVIDER_SITE_OTHER): Payer: BLUE CROSS/BLUE SHIELD | Admitting: Family Medicine

## 2018-11-05 DIAGNOSIS — F419 Anxiety disorder, unspecified: Secondary | ICD-10-CM

## 2018-11-05 DIAGNOSIS — F909 Attention-deficit hyperactivity disorder, unspecified type: Secondary | ICD-10-CM | POA: Diagnosis not present

## 2018-11-05 DIAGNOSIS — K219 Gastro-esophageal reflux disease without esophagitis: Secondary | ICD-10-CM | POA: Diagnosis not present

## 2018-11-05 MED ORDER — BUSPIRONE HCL 7.5 MG PO TABS: 7.5000 mg | ORAL_TABLET | Freq: Two times a day (BID) | ORAL | 1 refills | Status: DC

## 2018-11-05 MED ORDER — LISDEXAMFETAMINE DIMESYLATE 70 MG PO CAPS: 70.0000 mg | ORAL_CAPSULE | Freq: Every day | ORAL | 0 refills | Status: DC

## 2018-11-05 NOTE — Assessment & Plan Note (Signed)
Refill of vyvanse sent to pharmacy.

## 2018-11-05 NOTE — Telephone Encounter (Signed)
Called pt and was unable to leave a VM to call back due to mailbox being full.  

## 2018-11-05 NOTE — Telephone Encounter (Signed)
Please call the patient and get him set up for 3-4 month follow-up. Thanks.

## 2018-11-05 NOTE — Assessment & Plan Note (Signed)
Improved though continues to be an issue. We will increase his buspar dose to 7.5 mg BID. He will take 1.5 tablets of his current 5 mg buspar tablet supply and then pick up the new prescription.

## 2018-11-05 NOTE — Telephone Encounter (Signed)
Called and spoke with pt. Pt scheduled for an appt

## 2018-11-05 NOTE — Telephone Encounter (Signed)
02/08/2019 @ 2:45 will do virtual visit if needed

## 2018-11-05 NOTE — Assessment & Plan Note (Signed)
Patient was advised to see GI. He was given the phone number from the letter that was sent to him. He will call to schedule an appointment.

## 2018-11-05 NOTE — Progress Notes (Signed)
Virtual Visit via video Note  This visit type was conducted due to national recommendations for restrictions regarding the COVID-19 pandemic (e.g. social distancing).  This format is felt to be most appropriate for this patient at this time.  All issues noted in this document were discussed and addressed.  No physical exam was performed (except for noted visual exam findings with Video Visits).   I connected with Cory Harding on 11/05/18 at  8:30 AM EDT by a video enabled telemedicine application and verified that I am speaking with the correct person using two identifiers. Location patient: home Location provider: work Persons participating in the virtual visit: patient, provider  I discussed the limitations, risks, security and privacy concerns of performing an evaluation and management service by telephone and the availability of in person appointments. I also discussed with the patient that there may be a patient responsible charge related to this service. The patient expressed understanding and agreed to proceed.  Reason for visit: follow-up  HPI: Anxiety: notes this is a little better on the buspar. He notes no depression. He ran out of the buspar for about 5 days and noted a difference off of this. He notes the anxiety is affecting his sleep. He tries to go to bed around 1 am and does not fall asleep until 3-4 am. He does play xbox until he goes to bed. He does not drink caffeine. He has 1-2 beers per week.   ADHD Medication: vyvanse Effectiveness: yes Palpitations: no Sleep difficulty: see above Appetite suppression: no  GERD:   Reflux symptoms: not when he takes the omeprazole   Abd pain: no   Blood in stool: no  Dysphagia: no   EGD: no history of this  Medication: omeprazole Patient previously referred to GI though he did not schedule an appointment.     ROS: See pertinent positives and negatives per HPI.  Past Medical History:  Diagnosis Date  . ADHD (attention  deficit hyperactivity disorder)   . Asthma   . GERD (gastroesophageal reflux disease)     No past surgical history on file.  Family History  Problem Relation Age of Onset  . Hypertension Maternal Grandfather   . Heart disease Maternal Grandfather   . Heart disease Paternal Grandfather   . Sudden death Paternal Grandfather   . Mental illness Paternal Grandfather   . Alcoholism Paternal Grandfather     SOCIAL HX: Non-smoker   Current Outpatient Medications:  .  busPIRone (BUSPAR) 7.5 MG tablet, Take 1 tablet (7.5 mg total) by mouth 2 (two) times daily., Disp: 180 tablet, Rfl: 1 .  cetirizine (ZYRTEC) 10 MG tablet, Take 10 mg by mouth daily., Disp: , Rfl:  .  cyclobenzaprine (FLEXERIL) 10 MG tablet, Take 0.5-1 tablets (5-10 mg total) by mouth at bedtime as needed for muscle spasms., Disp: 10 tablet, Rfl: 0 .  fluticasone (FLONASE) 50 MCG/ACT nasal spray, Place 2 sprays into both nostrils daily., Disp: 16 g, Rfl: 6 .  [START ON 01/15/2019] lisdexamfetamine (VYVANSE) 70 MG capsule, Take 1 capsule (70 mg total) by mouth daily., Disp: 30 capsule, Rfl: 0 .  [START ON 12/16/2018] lisdexamfetamine (VYVANSE) 70 MG capsule, Take 1 capsule (70 mg total) by mouth daily., Disp: 30 capsule, Rfl: 0 .  [START ON 11/15/2018] lisdexamfetamine (VYVANSE) 70 MG capsule, Take 1 capsule (70 mg total) by mouth daily., Disp: 30 capsule, Rfl: 0 .  omeprazole (PRILOSEC) 20 MG capsule, Take 1 capsule (20 mg total) by mouth every other day., Disp: 45  capsule, Rfl: 1  EXAM:  VITALS per patient if applicable: None.  GENERAL: alert, oriented, appears well and in no acute distress  HEENT: atraumatic, conjunttiva clear, no obvious abnormalities on inspection of external nose and ears  NECK: normal movements of the head and neck  LUNGS: on inspection no signs of respiratory distress, breathing rate appears normal, no obvious gross SOB, gasping or wheezing  CV: no obvious cyanosis  MS: moves all visible  extremities without noticeable abnormality  PSYCH/NEURO: pleasant and cooperative, no obvious depression or anxiety, speech and thought processing grossly intact  ASSESSMENT AND PLAN:  Discussed the following assessment and plan:  Attention deficit hyperactivity disorder (ADHD), unspecified ADHD type - Plan: lisdexamfetamine (VYVANSE) 70 MG capsule  Gastroesophageal reflux disease, esophagitis presence not specified  Anxiety  GERD (gastroesophageal reflux disease) Patient was advised to see GI. He was given the phone number from the letter that was sent to him. He will call to schedule an appointment.   Anxiety Improved though continues to be an issue. We will increase his buspar dose to 7.5 mg BID. He will take 1.5 tablets of his current 5 mg buspar tablet supply and then pick up the new prescription.   Attention deficit hyperactivity disorder (ADHD) Refill of vyvanse sent to pharmacy.   Social distancing precautions and sick precautions given regarding COVID19.    I discussed the assessment and treatment plan with the patient. The patient was provided an opportunity to ask questions and all were answered. The patient agreed with the plan and demonstrated an understanding of the instructions.   The patient was advised to call back or seek an in-person evaluation if the symptoms worsen or if the condition fails to improve as anticipated.   [name], MD

## 2019-02-04 DIAGNOSIS — Z20828 Contact with and (suspected) exposure to other viral communicable diseases: Secondary | ICD-10-CM | POA: Diagnosis not present

## 2019-02-08 ENCOUNTER — Ambulatory Visit (INDEPENDENT_AMBULATORY_CARE_PROVIDER_SITE_OTHER): Payer: BC Managed Care – PPO | Admitting: Family Medicine

## 2019-02-08 ENCOUNTER — Other Ambulatory Visit: Payer: Self-pay

## 2019-02-08 DIAGNOSIS — J309 Allergic rhinitis, unspecified: Secondary | ICD-10-CM

## 2019-02-08 DIAGNOSIS — F419 Anxiety disorder, unspecified: Secondary | ICD-10-CM | POA: Diagnosis not present

## 2019-02-08 DIAGNOSIS — M545 Low back pain, unspecified: Secondary | ICD-10-CM

## 2019-02-08 DIAGNOSIS — Z20828 Contact with and (suspected) exposure to other viral communicable diseases: Secondary | ICD-10-CM | POA: Diagnosis not present

## 2019-02-08 DIAGNOSIS — Z20822 Contact with and (suspected) exposure to covid-19: Secondary | ICD-10-CM

## 2019-02-08 DIAGNOSIS — F909 Attention-deficit hyperactivity disorder, unspecified type: Secondary | ICD-10-CM

## 2019-02-08 DIAGNOSIS — N503 Cyst of epididymis: Secondary | ICD-10-CM
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
## 2019-02-09 DIAGNOSIS — M545 Low back pain, unspecified: Secondary | ICD-10-CM | POA: Insufficient documentation

## 2019-02-09 DIAGNOSIS — Z20822 Contact with and (suspected) exposure to covid-19: Secondary | ICD-10-CM | POA: Insufficient documentation

## 2019-02-09 DIAGNOSIS — Z20828 Contact with and (suspected) exposure to other viral communicable diseases: Secondary | ICD-10-CM | POA: Insufficient documentation

## 2019-02-09 MED ORDER — LISDEXAMFETAMINE DIMESYLATE 70 MG PO CAPS: 70.0000 mg | ORAL_CAPSULE | Freq: Every day | ORAL | 0 refills | Status: DC

## 2019-02-09 NOTE — Assessment & Plan Note (Signed)
He will switch back to his prior allergy medication.  He will monitor his symptoms.

## 2019-02-09 NOTE — Assessment & Plan Note (Signed)
Continues to be present.  He denies any symptoms from this.

## 2019-02-09 NOTE — Assessment & Plan Note (Signed)
Patient was exposed COVID-19 though has had a negative test.  I discussed continuing quarantine for a full 14 days to see if he develops any new symptoms.  He will contact us if he develops any symptoms.

## 2019-02-09 NOTE — Assessment & Plan Note (Signed)
Given persistence he will come in for an x-ray.  We will then determine if PT is warranted.

## 2019-02-09 NOTE — Assessment & Plan Note (Signed)
Continue Vyvanse

## 2019-02-09 NOTE — Assessment & Plan Note (Signed)
Improved.  Continue BuSpar.

## 2019-02-25 ENCOUNTER — Other Ambulatory Visit: Payer: Self-pay

## 2019-02-25 ENCOUNTER — Ambulatory Visit (INDEPENDENT_AMBULATORY_CARE_PROVIDER_SITE_OTHER)
Admission: RE | Admit: 2019-02-25 | Discharge: 2019-02-25 | Disposition: A | Discharge status: Home / Self Care | Payer: BC Managed Care – PPO | Source: Ambulatory Visit | Attending: Family Medicine | Admitting: Family Medicine

## 2019-02-25 DIAGNOSIS — M545 Low back pain, unspecified: Secondary | ICD-10-CM

## 2019-03-05 ENCOUNTER — Telehealth: Payer: Self-pay

## 2019-03-05 ENCOUNTER — Other Ambulatory Visit: Payer: Self-pay | Admitting: Family Medicine

## 2019-03-05 DIAGNOSIS — M545 Low back pain, unspecified: Secondary | ICD-10-CM

## 2019-03-05 MED ORDER — OMEPRAZOLE 20 MG PO CPDR: 20.0000 mg | DELAYED_RELEASE_CAPSULE | ORAL | 1 refills | Status: DC

## 2019-03-05 NOTE — Telephone Encounter (Signed)
I called and spoke with the patient's mother , no answer when called the patient and she stated a orthopedic referral is fine.  Zeva Leber,cma

## 2019-03-05 NOTE — Telephone Encounter (Signed)
Medication refill: omeprazole (PRILOSEC) 20 MG capsule [id number]  Pt mother states that the pt is taking everyday and would like a month supply sent to pharmacy because it is cheaper. Please advise    [place], [place] [phone] (Phone) [phone] (Fax)

## 2019-03-05 NOTE — Telephone Encounter (Signed)
Referral placed.

## 2019-03-05 NOTE — Telephone Encounter (Signed)
Copied from [place] [id number]. Topic: Referral - Status >> Mar 05, 2019 10:10 AM Rayann Heman wrote: Reason for CRM: pt mother called and stated that she has requested a referral for back pain 2 weeks ago and has not heard back. Please advise

## 2019-03-05 NOTE — Telephone Encounter (Signed)
The patient requested a neurology referral for this issue on 02/28/19. I would recommend an orthopedic referral as opposed to a neurology referral. If the patient is ok with the orthopedic referral I can place this. Please call the patient regarding this. Thanks.

## 2019-03-05 NOTE — Telephone Encounter (Signed)
pt mother called and stated that she has requested a referral for back pain 2 weeks ago and has not heard back. Please advise.  Nina,cma

## 2019-03-06 NOTE — Telephone Encounter (Signed)
Pt called and stated that he would like a call back from the nurse. Pt states that pain level has went from a 5 -8 in the last two days. Pt also states that this is a constant pain. Pt states that it is affecting work and sleep. Please advise. Pt would like to know if referral can be put in as urgent.

## 2019-03-06 NOTE — Telephone Encounter (Signed)
Patient back pain has increased from a five to an 8 requesting Urgent referral.

## 2019-03-06 NOTE — Telephone Encounter (Signed)
Per DPR left detailed message on patient mother mobile number, advising he should go to [facility], If patient or patient mother returns cal [facility] nurse may advise.

## 2019-03-06 NOTE — Telephone Encounter (Signed)
If his pain has increased to that degree he should go to the [facility] urgent clinic in [place] for evaluation. They should be open between 1-7 pm Monday-Friday.

## 2019-03-07 DIAGNOSIS — M545 Low back pain: Secondary | ICD-10-CM | POA: Diagnosis not present

## 2019-03-13 DIAGNOSIS — M545 Low back pain: Secondary | ICD-10-CM | POA: Diagnosis not present

## 2019-03-13 DIAGNOSIS — S39012D Strain of muscle, fascia and tendon of lower back, subsequent encounter: Secondary | ICD-10-CM | POA: Diagnosis not present

## 2019-03-15 ENCOUNTER — Telehealth: Payer: Self-pay | Admitting: Family Medicine

## 2019-03-15 NOTE — Telephone Encounter (Signed)
Patient calling to inquire if Dr. Caryl Bis would be willing to call in a mouthwash for thrush. Patient states he was given RX for prednisone from [facility] and feels as if it has resulted in thrush. Please advise.

## 2019-03-15 NOTE — Telephone Encounter (Signed)
Called patient to get more information about symptoms.  No answer.  Unable to leave a message since voicemail box was full.

## 2019-03-15 NOTE — Telephone Encounter (Signed)
Please attempt to call the patient on Monday.

## 2019-03-18 NOTE — Telephone Encounter (Signed)
Noted.  Please send him a letter advising that we have been trying to reach him and asking him to call the office.  Thanks.

## 2019-03-18 NOTE — Telephone Encounter (Signed)
Called patient on both home and cell phone numbers.  No answer.  Unable to leave message on home number due to voice mailbox being full.  LMTCB on cell phone number.

## 2019-03-18 NOTE — Telephone Encounter (Signed)
Called patient again.  No answer.  Unable to leave a message since voicemail box was full.

## 2019-03-20 DIAGNOSIS — M545 Low back pain: Secondary | ICD-10-CM | POA: Diagnosis not present

## 2019-03-20 DIAGNOSIS — S39012D Strain of muscle, fascia and tendon of lower back, subsequent encounter: Secondary | ICD-10-CM | POA: Diagnosis not present

## 2019-03-27 DIAGNOSIS — M545 Low back pain: Secondary | ICD-10-CM | POA: Diagnosis not present

## 2019-03-27 DIAGNOSIS — S39012D Strain of muscle, fascia and tendon of lower back, subsequent encounter: Secondary | ICD-10-CM | POA: Diagnosis not present

## 2019-04-01 NOTE — Telephone Encounter (Signed)
Called patient again.  Unable to leave message.  Mailed letter.

## 2019-04-10 DIAGNOSIS — S39012D Strain of muscle, fascia and tendon of lower back, subsequent encounter: Secondary | ICD-10-CM | POA: Diagnosis not present

## 2019-04-10 DIAGNOSIS — M545 Low back pain: Secondary | ICD-10-CM | POA: Diagnosis not present

## 2019-04-17 DIAGNOSIS — M545 Low back pain: Secondary | ICD-10-CM | POA: Diagnosis not present

## 2019-04-17 DIAGNOSIS — S39012D Strain of muscle, fascia and tendon of lower back, subsequent encounter: Secondary | ICD-10-CM | POA: Diagnosis not present

## 2019-04-23 ENCOUNTER — Telehealth: Payer: Self-pay | Admitting: *Deleted

## 2019-04-23 NOTE — Telephone Encounter (Signed)
Tried to reach patient to schedule no answer voicemail is full.

## 2019-04-23 NOTE — Telephone Encounter (Signed)
Copied from [place] [phone]. Topic: Referral - Request for Referral >> Apr 23, 2019  9:09 AM Scherrie Gerlach wrote: Pt has an ingrown hair in private area that will not go away.. pt requesting referral to dermatology. Unless Dr Caryl Bis can handle this. Pt works 3rd shift, so will be asleep rest of day. Ok to LM, or call before 10 am in the day.

## 2019-04-23 NOTE — Telephone Encounter (Signed)
It would be best to see him in the office to determine if this is something that I can take care or if he needs a referral to dermatology.

## 2019-04-24 DIAGNOSIS — M545 Low back pain: Secondary | ICD-10-CM | POA: Diagnosis not present

## 2019-04-24 DIAGNOSIS — S39012D Strain of muscle, fascia and tendon of lower back, subsequent encounter: Secondary | ICD-10-CM | POA: Diagnosis not present

## 2019-04-26 NOTE — Telephone Encounter (Signed)
Tried to reach patient by phone. No answer no voicemail

## 2019-05-06 DIAGNOSIS — M545 Low back pain: Secondary | ICD-10-CM | POA: Diagnosis not present

## 2019-05-08 DIAGNOSIS — M545 Low back pain: Secondary | ICD-10-CM | POA: Diagnosis not present

## 2019-05-08 DIAGNOSIS — S39012D Strain of muscle, fascia and tendon of lower back, subsequent encounter: Secondary | ICD-10-CM | POA: Diagnosis not present

## 2019-05-10 ENCOUNTER — Other Ambulatory Visit: Payer: Self-pay

## 2019-05-14 ENCOUNTER — Other Ambulatory Visit: Payer: Self-pay

## 2019-05-14 ENCOUNTER — Encounter: Payer: Self-pay | Admitting: Family Medicine

## 2019-05-14 ENCOUNTER — Ambulatory Visit: Payer: BC Managed Care – PPO | Admitting: Family Medicine

## 2019-05-14 VITALS — BP 110/70 | HR 85 | Temp 96.6°F | Ht 69.5 in | Wt 189.0 lb

## 2019-05-14 DIAGNOSIS — M545 Low back pain, unspecified: Secondary | ICD-10-CM

## 2019-05-14 DIAGNOSIS — F419 Anxiety disorder, unspecified: Secondary | ICD-10-CM

## 2019-05-14 DIAGNOSIS — Z23 Encounter for immunization: Secondary | ICD-10-CM | POA: Diagnosis not present

## 2019-05-14 DIAGNOSIS — Z8709 Personal history of other diseases of the respiratory system: Secondary | ICD-10-CM | POA: Insufficient documentation

## 2019-05-14 DIAGNOSIS — F909 Attention-deficit hyperactivity disorder, unspecified type: Secondary | ICD-10-CM | POA: Diagnosis not present

## 2019-05-14 MED ORDER — LISDEXAMFETAMINE DIMESYLATE 70 MG PO CAPS: 70.0000 mg | ORAL_CAPSULE | Freq: Every day | ORAL | 0 refills | Status: DC

## 2019-05-14 MED ORDER — ALBUTEROL SULFATE HFA 108 (90 BASE) MCG/ACT IN AERS: 2.0000 | INHALATION_SPRAY | Freq: Four times a day (QID) | RESPIRATORY_TRACT | 1 refills | Status: DC | PRN

## 2019-05-14 MED ORDER — BUSPIRONE HCL 10 MG PO TABS: 10.0000 mg | ORAL_TABLET | Freq: Two times a day (BID) | ORAL | 3 refills | Status: DC

## 2019-05-14 NOTE — Assessment & Plan Note (Signed)
Progressively improving.  He will continue to see orthopedics.

## 2019-05-14 NOTE — Assessment & Plan Note (Signed)
Stable.  Continue ADHD.  Controlled substance database reviewed.  Refills given.

## 2019-05-14 NOTE — Assessment & Plan Note (Signed)
Worsened.  We will increase his BuSpar.  We will refer for therapy.

## 2019-05-14 NOTE — Patient Instructions (Signed)
Nice to see you. We will get you referred to psychology for therapy. Please try the albuterol prior to exerting yourself. If you have worsening issues with wheezing please let us know.

## 2019-05-14 NOTE — Assessment & Plan Note (Signed)
Rare occasional wheezing recently.  We will provide him with an albuterol inhaler to use prior to exercise if needed.  He will monitor.
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
## 2019-05-15 DIAGNOSIS — S39012D Strain of muscle, fascia and tendon of lower back, subsequent encounter: Secondary | ICD-10-CM | POA: Diagnosis not present

## 2019-05-15 DIAGNOSIS — M545 Low back pain: Secondary | ICD-10-CM | POA: Diagnosis not present

## 2019-05-17 ENCOUNTER — Ambulatory Visit: Payer: BC Managed Care – PPO | Admitting: Family Medicine

## 2019-05-17 ENCOUNTER — Encounter: Payer: Self-pay | Admitting: Family Medicine

## 2019-05-19 ENCOUNTER — Encounter: Payer: Self-pay | Admitting: Family Medicine

## 2019-05-22 DIAGNOSIS — M545 Low back pain: Secondary | ICD-10-CM | POA: Diagnosis not present

## 2019-05-22 DIAGNOSIS — S39012D Strain of muscle, fascia and tendon of lower back, subsequent encounter: Secondary | ICD-10-CM | POA: Diagnosis not present

## 2019-07-01 DIAGNOSIS — Z20828 Contact with and (suspected) exposure to other viral communicable diseases: Secondary | ICD-10-CM | POA: Diagnosis not present

## 2019-07-08 ENCOUNTER — Ambulatory Visit (INDEPENDENT_AMBULATORY_CARE_PROVIDER_SITE_OTHER): Payer: BC Managed Care – PPO | Admitting: Psychology

## 2019-07-08 DIAGNOSIS — F4310 Post-traumatic stress disorder, unspecified: Secondary | ICD-10-CM

## 2019-07-22 ENCOUNTER — Telehealth: Payer: BC Managed Care – PPO | Admitting: Physician Assistant

## 2019-07-22 ENCOUNTER — Telehealth: Payer: BC Managed Care – PPO

## 2019-07-22 DIAGNOSIS — R05 Cough: Secondary | ICD-10-CM | POA: Diagnosis not present

## 2019-07-22 DIAGNOSIS — J069 Acute upper respiratory infection, unspecified: Secondary | ICD-10-CM | POA: Diagnosis not present

## 2019-07-22 DIAGNOSIS — R059 Cough, unspecified: Secondary | ICD-10-CM

## 2019-07-22 MED ORDER — BENZONATATE 100 MG PO CAPS: 100.0000 mg | ORAL_CAPSULE | Freq: Three times a day (TID) | ORAL | 0 refills | Status: DC | PRN

## 2019-07-22 NOTE — Progress Notes (Signed)
We are sorry you are not feeling well.  Here is how we plan to help!  Based on what you have shared with me, it looks like you may have a viral upper respiratory infection.  Upper respiratory infections are caused by a large number of viruses; however, rhinovirus is the most common cause.   Symptoms vary from person to person, with common symptoms including sore throat, cough, fatigue or lack of energy and feeling of general discomfort.  A low-grade fever of up to 100.4 may present, but is often uncommon.  Symptoms vary however, and are closely related to a person's age or underlying illnesses.  The most common symptoms associated with an upper respiratory infection are nasal discharge or congestion, cough, sneezing, headache and pressure in the ears and face.  These symptoms usually persist for about 3 to 10 days, but can last up to 2 weeks.  It is important to know that upper respiratory infections do not cause serious illness or complications in most cases.    Upper respiratory infections can be transmitted from person to person, with the most common method of transmission being a person's hands.  The virus is able to live on the skin and can infect other persons for up to 2 hours after direct contact.  Also, these can be transmitted when someone coughs or sneezes; thus, it is important to cover the mouth to reduce this risk.  To keep the spread of the illness at [place], good hand hygiene is very important.  This is an infection that is most likely caused by a virus. There are no specific treatments other than to help you with the symptoms until the infection runs its course.  We are sorry you are not feeling well.  Here is how we plan to help!  If you do not have a history of heart disease, hypertension, diabetes or thyroid disease, prostate/bladder issues or glaucoma, you may also use Sudafed to treat nasal congestion.  It is highly recommended that you consult with a pharmacist or your primary care  physician to ensure this medication is safe for you to take.     If you have a cough, you may use cough suppressants such as Delsym and Robitussin.  If you have glaucoma or high blood pressure, you can also use Coricidin HBP.    For cough I have prescribed for you A prescription cough medication called Tessalon Perles 100 mg. You may take 1-2 capsules every 8 hours as needed for cough  Stay well hydrated. Drink enough water and fluids to keep your urine clear or pale yellow. Get lost of rest. Wash your hands often.   Advil or ibuprofen for pain. Do not take Aspirin.   Cepacol throat lozenges. Gargle with 8 oz of salt water ( tsp of salt per 1 qt of water) as often as every 1-2 hours to soothe your throat. For sore throat try using a honey-based tea. Use 3 teaspoons of honey with juice squeezed from half lemon. Place shaved pieces of ginger into 1/2-1 cup of water and warm over stove top. Then mix the ingredients and repeat every 4 hours as needed.  -Foods that can help speed recovery: honey, garlic, chicken soup, elderberries, green tea.  -Supplements that can help speed recovery: vitamin C, zinc, elderberry extract, quercetin, ginseng, selenium   If you have a sore or scratchy throat, use a saltwater gargle-  to  teaspoon of salt dissolved in a 4-ounce to 8-ounce glass of warm water.  Gargle the solution for approximately 15-30 seconds and then spit.  It is important not to swallow the solution.  You can also use throat lozenges/cough drops and Chloraseptic spray to help with throat pain or discomfort.  Warm or cold liquids can also be helpful in relieving throat pain.  For headache, pain or general discomfort, you can use Ibuprofen or Tylenol as directed.   Some authorities believe that zinc sprays or the use of Echinacea may shorten the course of your symptoms.   HOME CARE . Only take medications as instructed by your medical team. . Be sure to drink plenty of fluids. Water is fine  as well as fruit juices, sodas and electrolyte beverages. You may want to stay away from caffeine or alcohol. If you are nauseated, try taking small sips of liquids. How do you know if you are getting enough fluid? Your urine should be a pale yellow or almost colorless. . Get rest. . Taking a steamy shower or using a humidifier may help nasal congestion and ease sore throat pain. You can place a towel over your head and breathe in the steam from hot water coming from a faucet. . Using a saline nasal spray works much the same way. . Cough drops, hard candies and sore throat lozenges may ease your cough. . Avoid close contacts especially the very young and the elderly . Cover your mouth if you cough or sneeze . Always remember to wash your hands.   GET HELP RIGHT AWAY IF: . You develop worsening fever. . If your symptoms do not improve within 10 days . You develop yellow or green discharge from your nose over 3 days. . You have coughing fits . You develop a severe head ache or visual changes. . You develop shortness of breath, difficulty breathing or start having chest pain . Your symptoms persist after you have completed your treatment plan  MAKE SURE YOU   Understand these instructions.  Will watch your condition.  Will get help right away if you are not doing well or get worse.  Your e-visit answers were reviewed by a board certified advanced clinical practitioner to complete your personal care plan. Depending upon the condition, your plan could have included both over the counter or prescription medications. Please review your pharmacy choice. If there is a problem, you may call our nursing hot line at and have the prescription routed to another pharmacy. Your safety is important to [place]. If you have drug allergies check your prescription carefully.   You can use MyChart to ask questions about today's visit, request a non-urgent call back, or ask for a work or school excuse for 24 hours  related to this e-Visit. If it has been greater than 24 hours you will need to follow up with your provider, or enter a new e-Visit to address those concerns. You will get an e-mail in the next two days asking about your experience.  I hope that your e-visit has been valuable and will speed your recovery. Thank you for using e-visits.     Greater than 5 minutes, yet less than 10 minutes of time have been spent researching, coordinating and implementing care for this patient today.

## 2019-07-30 ENCOUNTER — Ambulatory Visit (INDEPENDENT_AMBULATORY_CARE_PROVIDER_SITE_OTHER): Payer: BC Managed Care – PPO | Admitting: Psychology

## 2019-07-30 DIAGNOSIS — F4310 Post-traumatic stress disorder, unspecified: Secondary | ICD-10-CM

## 2019-08-06 ENCOUNTER — Ambulatory Visit (INDEPENDENT_AMBULATORY_CARE_PROVIDER_SITE_OTHER): Payer: BC Managed Care – PPO | Admitting: Psychology

## 2019-08-06 DIAGNOSIS — F4310 Post-traumatic stress disorder, unspecified: Secondary | ICD-10-CM | POA: Diagnosis not present

## 2019-08-14 ENCOUNTER — Ambulatory Visit (INDEPENDENT_AMBULATORY_CARE_PROVIDER_SITE_OTHER): Payer: BC Managed Care – PPO | Admitting: Psychology

## 2019-08-14 DIAGNOSIS — F4310 Post-traumatic stress disorder, unspecified: Secondary | ICD-10-CM

## 2019-08-20 ENCOUNTER — Ambulatory Visit (INDEPENDENT_AMBULATORY_CARE_PROVIDER_SITE_OTHER): Payer: BC Managed Care – PPO | Admitting: Psychology

## 2019-08-20 ENCOUNTER — Encounter: Payer: Self-pay | Admitting: Family Medicine

## 2019-08-20 ENCOUNTER — Ambulatory Visit (INDEPENDENT_AMBULATORY_CARE_PROVIDER_SITE_OTHER): Payer: BC Managed Care – PPO | Admitting: Family Medicine

## 2019-08-20 ENCOUNTER — Other Ambulatory Visit: Payer: Self-pay

## 2019-08-20 ENCOUNTER — Telehealth: Payer: Self-pay | Admitting: Family Medicine

## 2019-08-20 VITALS — Ht 69.0 in | Wt 185.0 lb

## 2019-08-20 DIAGNOSIS — N503 Cyst of epididymis: Secondary | ICD-10-CM | POA: Diagnosis not present

## 2019-08-20 DIAGNOSIS — J989 Respiratory disorder, unspecified: Secondary | ICD-10-CM

## 2019-08-20 DIAGNOSIS — F4310 Post-traumatic stress disorder, unspecified: Secondary | ICD-10-CM

## 2019-08-20 DIAGNOSIS — F909 Attention-deficit hyperactivity disorder, unspecified type: Secondary | ICD-10-CM

## 2019-08-20 DIAGNOSIS — F419 Anxiety disorder, unspecified: Secondary | ICD-10-CM | POA: Diagnosis not present

## 2019-08-20 DIAGNOSIS — L731 Pseudofolliculitis barbae: Secondary | ICD-10-CM | POA: Diagnosis not present

## 2019-08-20 DIAGNOSIS — K6289 Other specified diseases of anus and rectum: Secondary | ICD-10-CM

## 2019-08-20 MED ORDER — LISDEXAMFETAMINE DIMESYLATE 70 MG PO CAPS: 70.0000 mg | ORAL_CAPSULE | Freq: Every day | ORAL | 0 refills | Status: DC

## 2019-08-20 MED ORDER — BUSPIRONE HCL 10 MG PO TABS: 10.0000 mg | ORAL_TABLET | Freq: Two times a day (BID) | ORAL | 3 refills | Status: DC

## 2019-08-20 MED ORDER — DOXYCYCLINE HYCLATE 100 MG PO TABS: 100.0000 mg | ORAL_TABLET | Freq: Two times a day (BID) | ORAL | 0 refills | Status: DC

## 2019-08-20 NOTE — Assessment & Plan Note (Signed)
Symptoms have resolved.  Discussed sore throat was likely related to postnasal drip.  Will monitor for any recurrence.

## 2019-08-20 NOTE — Assessment & Plan Note (Signed)
Improving.  Continue therapy and BuSpar.

## 2019-08-20 NOTE — Assessment & Plan Note (Signed)
Discussed benign nature of this.  Advised that there is no specific reason to follow-up on this unless he develops discomfort or if it enlarges.  Discussed referral to urology if he would prefer that.  He will monitor for now and let us know if he would like to see urology in the future.

## 2019-08-20 NOTE — Assessment & Plan Note (Signed)
Discussed that this could represent spasm or related to possible hemorrhoids or other cause.  We will have him into the office in the next several weeks to evaluate this further in person.

## 2019-08-20 NOTE — Assessment & Plan Note (Signed)
Sounds to be infected.  We will treat with doxycycline.  If not improving to let us know.  Refer to dermatology given recurrent issues with this.
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
## 2019-08-20 NOTE — Assessment & Plan Note (Signed)
Stable.  Continue Vyvanse.  Refills given.  Controlled substance database reviewed.

## 2019-08-20 NOTE — Telephone Encounter (Signed)
LVM to set up appt for 3 wk follow up in person for rectal pain

## 2019-08-27 ENCOUNTER — Ambulatory Visit (INDEPENDENT_AMBULATORY_CARE_PROVIDER_SITE_OTHER): Payer: BC Managed Care – PPO | Admitting: Psychology

## 2019-08-27 DIAGNOSIS — F4310 Post-traumatic stress disorder, unspecified: Secondary | ICD-10-CM | POA: Diagnosis not present

## 2019-09-01 ENCOUNTER — Other Ambulatory Visit: Payer: Self-pay | Admitting: Family Medicine

## 2019-09-03 ENCOUNTER — Ambulatory Visit (INDEPENDENT_AMBULATORY_CARE_PROVIDER_SITE_OTHER): Payer: BC Managed Care – PPO | Admitting: Psychology

## 2019-09-03 DIAGNOSIS — F4310 Post-traumatic stress disorder, unspecified: Secondary | ICD-10-CM

## 2019-09-10 ENCOUNTER — Ambulatory Visit (INDEPENDENT_AMBULATORY_CARE_PROVIDER_SITE_OTHER): Payer: BC Managed Care – PPO | Admitting: Psychology

## 2019-09-10 DIAGNOSIS — F4310 Post-traumatic stress disorder, unspecified: Secondary | ICD-10-CM | POA: Diagnosis not present

## 2019-09-17 ENCOUNTER — Ambulatory Visit (INDEPENDENT_AMBULATORY_CARE_PROVIDER_SITE_OTHER): Payer: BC Managed Care – PPO | Admitting: Psychology

## 2019-09-17 DIAGNOSIS — F4310 Post-traumatic stress disorder, unspecified: Secondary | ICD-10-CM | POA: Diagnosis not present

## 2019-09-24 ENCOUNTER — Ambulatory Visit (INDEPENDENT_AMBULATORY_CARE_PROVIDER_SITE_OTHER): Payer: BC Managed Care – PPO | Admitting: Psychology

## 2019-09-24 DIAGNOSIS — F4310 Post-traumatic stress disorder, unspecified: Secondary | ICD-10-CM | POA: Diagnosis not present

## 2019-10-01 ENCOUNTER — Ambulatory Visit (INDEPENDENT_AMBULATORY_CARE_PROVIDER_SITE_OTHER): Payer: BC Managed Care – PPO | Admitting: Psychology

## 2019-10-01 DIAGNOSIS — F4310 Post-traumatic stress disorder, unspecified: Secondary | ICD-10-CM

## 2019-10-08 ENCOUNTER — Ambulatory Visit: Payer: BC Managed Care – PPO | Admitting: Psychology

## 2019-10-22 ENCOUNTER — Ambulatory Visit (INDEPENDENT_AMBULATORY_CARE_PROVIDER_SITE_OTHER): Payer: BC Managed Care – PPO | Admitting: Psychology

## 2019-10-22 DIAGNOSIS — F4310 Post-traumatic stress disorder, unspecified: Secondary | ICD-10-CM

## 2019-10-24 ENCOUNTER — Telehealth: Payer: Self-pay | Admitting: Family Medicine

## 2019-10-24 DIAGNOSIS — Z20828 Contact with and (suspected) exposure to other viral communicable diseases: Secondary | ICD-10-CM | POA: Diagnosis not present

## 2019-10-24 DIAGNOSIS — Z03818 Encounter for observation for suspected exposure to other biological agents ruled out: Secondary | ICD-10-CM | POA: Diagnosis not present

## 2019-10-24 NOTE — Telephone Encounter (Signed)
I called and spoke with the patient and informed him that due to his symptoms he needed to be tested for Covid and he also had to quarantine until he gets his results, he also asked If everyone in the house should be tested and I informed him that they all should and they all should be quarantined until the results come back, he understood.  Juleon Narang,cma

## 2019-10-24 NOTE — Telephone Encounter (Signed)
Pt states that he has congestion, body aches, headache, and sore throat. Pt states that he was going to get a covid test today and I gave him a couple of locations to get one. Pt would like a call back to advise.

## 2019-10-29 ENCOUNTER — Ambulatory Visit (INDEPENDENT_AMBULATORY_CARE_PROVIDER_SITE_OTHER): Payer: BC Managed Care – PPO | Admitting: Psychology

## 2019-10-29 DIAGNOSIS — F4310 Post-traumatic stress disorder, unspecified: Secondary | ICD-10-CM

## 2019-10-30 ENCOUNTER — Encounter: Payer: Self-pay | Admitting: Family Medicine

## 2019-11-05 ENCOUNTER — Ambulatory Visit (INDEPENDENT_AMBULATORY_CARE_PROVIDER_SITE_OTHER): Payer: BC Managed Care – PPO | Admitting: Psychology

## 2019-11-05 DIAGNOSIS — F4310 Post-traumatic stress disorder, unspecified: Secondary | ICD-10-CM

## 2019-11-12 ENCOUNTER — Ambulatory Visit (INDEPENDENT_AMBULATORY_CARE_PROVIDER_SITE_OTHER): Payer: BC Managed Care – PPO | Admitting: Psychology

## 2019-11-12 DIAGNOSIS — F4310 Post-traumatic stress disorder, unspecified: Secondary | ICD-10-CM | POA: Diagnosis not present

## 2019-11-19 ENCOUNTER — Ambulatory Visit (INDEPENDENT_AMBULATORY_CARE_PROVIDER_SITE_OTHER): Payer: BC Managed Care – PPO | Admitting: Psychology

## 2019-11-19 DIAGNOSIS — F4310 Post-traumatic stress disorder, unspecified: Secondary | ICD-10-CM

## 2019-11-26 ENCOUNTER — Ambulatory Visit: Payer: BC Managed Care – PPO | Admitting: Psychology

## 2019-12-03 ENCOUNTER — Ambulatory Visit (INDEPENDENT_AMBULATORY_CARE_PROVIDER_SITE_OTHER): Payer: BC Managed Care – PPO | Admitting: Psychology

## 2019-12-03 DIAGNOSIS — F4310 Post-traumatic stress disorder, unspecified: Secondary | ICD-10-CM

## 2019-12-04 ENCOUNTER — Other Ambulatory Visit: Payer: Self-pay

## 2019-12-04 MED ORDER — BUSPIRONE HCL 10 MG PO TABS: 10.0000 mg | ORAL_TABLET | Freq: Two times a day (BID) | ORAL | 3 refills | Status: DC

## 2019-12-10 ENCOUNTER — Ambulatory Visit: Payer: BC Managed Care – PPO | Admitting: Psychology

## 2019-12-14 ENCOUNTER — Encounter: Payer: Self-pay | Admitting: Family Medicine

## 2019-12-16 ENCOUNTER — Other Ambulatory Visit: Payer: Self-pay

## 2019-12-16 ENCOUNTER — Telehealth: Payer: Self-pay | Admitting: Family Medicine

## 2019-12-16 NOTE — Telephone Encounter (Signed)
Refill request for [facility], last seen 08-20-19, last filled 11-06-19.  Please advise.

## 2019-12-16 NOTE — Telephone Encounter (Signed)
He needs an appointment

## 2019-12-16 NOTE — Telephone Encounter (Signed)
Patient has appointment tomorrow

## 2019-12-16 NOTE — Telephone Encounter (Signed)
Pt called in stated that he was out of his medication lisdexamfetamine (VYVANSE) 70 MG capsule

## 2019-12-17 ENCOUNTER — Ambulatory Visit (INDEPENDENT_AMBULATORY_CARE_PROVIDER_SITE_OTHER): Payer: BC Managed Care – PPO | Admitting: Psychology

## 2019-12-17 ENCOUNTER — Ambulatory Visit: Payer: BC Managed Care – PPO | Admitting: Family Medicine

## 2019-12-17 ENCOUNTER — Encounter: Payer: Self-pay | Admitting: Family Medicine

## 2019-12-17 DIAGNOSIS — F4310 Post-traumatic stress disorder, unspecified: Secondary | ICD-10-CM | POA: Diagnosis not present

## 2019-12-17 DIAGNOSIS — L731 Pseudofolliculitis barbae: Secondary | ICD-10-CM | POA: Diagnosis not present

## 2019-12-17 DIAGNOSIS — F909 Attention-deficit hyperactivity disorder, unspecified type: Secondary | ICD-10-CM | POA: Diagnosis not present

## 2019-12-17 DIAGNOSIS — Z789 Other specified health status: Secondary | ICD-10-CM | POA: Diagnosis not present

## 2019-12-17 MED ORDER — LISDEXAMFETAMINE DIMESYLATE 70 MG PO CAPS: 70.0000 mg | ORAL_CAPSULE | Freq: Every day | ORAL | 0 refills | Status: DC

## 2019-12-17 MED ORDER — DOXYCYCLINE HYCLATE 100 MG PO TABS: 100.0000 mg | ORAL_TABLET | Freq: Two times a day (BID) | ORAL | 0 refills | Status: DC

## 2019-12-17 NOTE — Assessment & Plan Note (Signed)
Continue Vyvanse.  Refill given.  Controlled substance database reviewed.

## 2019-12-17 NOTE — Progress Notes (Signed)
[name], MD Phone: [phone]  Cory Harding is a 19 y.o. male who presents today for f/u.  ADHD Medication: vyvanse Effectiveness: yes Palpitations: no Sleep difficulty: no Appetite suppression: no  Ingrown hair: This is a recurrent issue for the patient.  It occurs on his waistline.  He does trim his hair though not all the way to the skin.  He notes this has been present for 3 days.  He feels as though there is going to be a lot of pus from it though it has not come to ahead.  No fevers.  No drainage.   Social History   Tobacco Use  Smoking Status Never Smoker  Smokeless Tobacco Never Used     ROS see history of present illness  Objective  Physical Exam Vitals:   12/17/19 1145  BP: 105/70  Pulse: 62  Temp: (!) 97.4 F (36.3 C)  SpO2: 98%    BP Readings from Last 3 Encounters:  12/17/19 105/70  05/14/19 110/70  08/29/18 120/78   Wt Readings from Last 3 Encounters:  12/17/19 212 lb 12.8 oz (96.5 kg)  08/20/19 185 lb (83.9 kg)  05/14/19 189 lb (85.7 kg)    Physical Exam Constitutional:      General: He is not in acute distress.    Appearance: He is not diaphoretic.  Cardiovascular:     Rate and Rhythm: Normal rate and regular rhythm.     Heart sounds: Normal heart sounds.  Pulmonary:     Effort: Pulmonary effort is normal.     Breath sounds: Normal breath sounds.  Skin:    General: Skin is warm and dry.       Neurological:     Mental Status: He is alert.      Assessment/Plan: Please see individual problem list.  Ingrown hair This is a recurrent issue.  We will treat his acute issue with doxycycline.  He will monitor for spreading redness and signs of systemic infection and if those occur he will be evaluated right away.  Advised using warm compresses to try to get it to drain.  If not improving he will let us know.  Advised to not trim his hair in this area so short.  Attention deficit hyperactivity disorder (ADHD) Continue  Vyvanse.  Refill given.  Controlled substance database reviewed.  Electronic cigarette use Patient reports he quit using electronic cigarettes.  I encouraged him to continue to abstain from this.   No orders of the defined types were placed in this encounter.   Meds ordered this encounter  Medications  . doxycycline (VIBRA-TABS) 100 MG tablet    Sig: Take 1 tablet (100 mg total) by mouth 2 (two) times daily.    Dispense:  14 tablet    Refill:  0  . lisdexamfetamine (VYVANSE) 70 MG capsule    Sig: Take 1 capsule (70 mg total) by mouth daily.    Dispense:  30 capsule    Refill:  0  . lisdexamfetamine (VYVANSE) 70 MG capsule    Sig: Take 1 capsule (70 mg total) by mouth daily.    Dispense:  30 capsule    Refill:  0  . lisdexamfetamine (VYVANSE) 70 MG capsule    Sig: Take 1 capsule (70 mg total) by mouth daily.    Dispense:  30 capsule    Refill:  0    This visit occurred during the SARS-CoV-2 public health emergency.  Safety protocols were in place, including screening questions prior to the  visit, additional usage of staff PPE, and extensive cleaning of exam room while observing appropriate contact time as indicated for disinfecting solutions.    [name], MD [place]

## 2019-12-17 NOTE — Assessment & Plan Note (Addendum)
This is a recurrent issue.  We will treat his acute issue with doxycycline.  He will monitor for spreading redness and signs of systemic infection and if those occur he will be evaluated right away.  Advised using warm compresses to try to get it to drain.  If not improving he will let us know.  Advised to not trim his hair in this area so short.

## 2019-12-17 NOTE — Patient Instructions (Addendum)
Nice to see you. We will place you on an antibiotic for the ingrown hair.  If you develop spreading redness, increasing pain, fevers, or other symptoms please seek medical attention immediately.  If it is not improving over the next week please let us know.

## 2019-12-17 NOTE — Assessment & Plan Note (Signed)
Patient reports he quit using electronic cigarettes.  I encouraged him to continue to abstain from this.

## 2019-12-24 ENCOUNTER — Ambulatory Visit (INDEPENDENT_AMBULATORY_CARE_PROVIDER_SITE_OTHER): Payer: BC Managed Care – PPO | Admitting: Psychology

## 2019-12-24 DIAGNOSIS — F4310 Post-traumatic stress disorder, unspecified: Secondary | ICD-10-CM | POA: Diagnosis not present

## 2019-12-31 ENCOUNTER — Ambulatory Visit: Payer: BC Managed Care – PPO | Admitting: Psychology

## 2020-01-07 ENCOUNTER — Ambulatory Visit (INDEPENDENT_AMBULATORY_CARE_PROVIDER_SITE_OTHER): Payer: BC Managed Care – PPO | Admitting: Psychology

## 2020-01-07 DIAGNOSIS — F4310 Post-traumatic stress disorder, unspecified: Secondary | ICD-10-CM

## 2020-01-08 ENCOUNTER — Ambulatory Visit: Payer: BC Managed Care – PPO | Admitting: Psychology

## 2020-01-15 ENCOUNTER — Ambulatory Visit (INDEPENDENT_AMBULATORY_CARE_PROVIDER_SITE_OTHER): Payer: BC Managed Care – PPO | Admitting: Psychology

## 2020-01-15 DIAGNOSIS — F4310 Post-traumatic stress disorder, unspecified: Secondary | ICD-10-CM | POA: Diagnosis not present

## 2020-01-21 ENCOUNTER — Ambulatory Visit (INDEPENDENT_AMBULATORY_CARE_PROVIDER_SITE_OTHER): Payer: BC Managed Care – PPO | Admitting: Psychology

## 2020-01-21 DIAGNOSIS — F4310 Post-traumatic stress disorder, unspecified: Secondary | ICD-10-CM

## 2020-01-28 ENCOUNTER — Other Ambulatory Visit: Payer: Self-pay

## 2020-01-28 ENCOUNTER — Ambulatory Visit (INDEPENDENT_AMBULATORY_CARE_PROVIDER_SITE_OTHER): Payer: BC Managed Care – PPO | Admitting: Psychology

## 2020-01-28 DIAGNOSIS — F4310 Post-traumatic stress disorder, unspecified: Secondary | ICD-10-CM

## 2020-02-03 ENCOUNTER — Ambulatory Visit (INDEPENDENT_AMBULATORY_CARE_PROVIDER_SITE_OTHER): Payer: BC Managed Care – PPO | Admitting: Psychology

## 2020-02-03 DIAGNOSIS — F4310 Post-traumatic stress disorder, unspecified: Secondary | ICD-10-CM | POA: Diagnosis not present

## 2020-02-10 ENCOUNTER — Ambulatory Visit (INDEPENDENT_AMBULATORY_CARE_PROVIDER_SITE_OTHER): Payer: BC Managed Care – PPO | Admitting: Psychology

## 2020-02-10 DIAGNOSIS — F4310 Post-traumatic stress disorder, unspecified: Secondary | ICD-10-CM | POA: Diagnosis not present

## 2020-03-06 ENCOUNTER — Ambulatory Visit (INDEPENDENT_AMBULATORY_CARE_PROVIDER_SITE_OTHER): Payer: BC Managed Care – PPO | Admitting: Psychology

## 2020-03-06 DIAGNOSIS — F4310 Post-traumatic stress disorder, unspecified: Secondary | ICD-10-CM

## 2020-03-13 ENCOUNTER — Telehealth: Payer: Self-pay | Admitting: Family Medicine

## 2020-03-13 ENCOUNTER — Ambulatory Visit (INDEPENDENT_AMBULATORY_CARE_PROVIDER_SITE_OTHER): Payer: BC Managed Care – PPO | Admitting: Psychology

## 2020-03-13 ENCOUNTER — Encounter: Payer: Self-pay | Admitting: Family Medicine

## 2020-03-13 DIAGNOSIS — F909 Attention-deficit hyperactivity disorder, unspecified type: Secondary | ICD-10-CM

## 2020-03-13 DIAGNOSIS — F4310 Post-traumatic stress disorder, unspecified: Secondary | ICD-10-CM

## 2020-03-13 MED ORDER — LISDEXAMFETAMINE DIMESYLATE 70 MG PO CAPS: 70.0000 mg | ORAL_CAPSULE | Freq: Every day | ORAL | 0 refills | Status: DC

## 2020-03-13 NOTE — Telephone Encounter (Signed)
Patient called & told that ONE month refill was sent. He knows he must keep appointment for refills.

## 2020-03-13 NOTE — Telephone Encounter (Signed)
Patient is requesting a refill on his lisdexamfetamine (VYVANSE) 70 MG capsule. Please sent to [facility] in [place]. Patient has an appointment on 03/23/20 with Dr. Caryl Bis. Patient will run out before appointment. Patient ask for a phone call from office when being filled.

## 2020-03-13 NOTE — Telephone Encounter (Signed)
Call pt  Refilled vyvanse for one month    I looked up patient on [place] Controlled Substances Reporting System PMP AWARE and saw no activity that raised concern of inappropriate use.

## 2020-03-13 NOTE — Telephone Encounter (Signed)
Refill request for vyvanse, last seen 12-17-19, last filled 12-17-19.  Please advise.

## 2020-03-20 ENCOUNTER — Ambulatory Visit (INDEPENDENT_AMBULATORY_CARE_PROVIDER_SITE_OTHER): Payer: BC Managed Care – PPO | Admitting: Psychology

## 2020-03-20 DIAGNOSIS — F4310 Post-traumatic stress disorder, unspecified: Secondary | ICD-10-CM

## 2020-03-23 ENCOUNTER — Other Ambulatory Visit: Payer: Self-pay

## 2020-03-23 ENCOUNTER — Encounter: Payer: Self-pay | Admitting: Family Medicine

## 2020-03-23 ENCOUNTER — Ambulatory Visit: Payer: BC Managed Care – PPO | Admitting: Family Medicine

## 2020-03-23 VITALS — BP 120/80 | HR 78 | Temp 98.6°F | Ht 69.0 in | Wt 219.6 lb

## 2020-03-23 DIAGNOSIS — Z8709 Personal history of other diseases of the respiratory system: Secondary | ICD-10-CM

## 2020-03-23 DIAGNOSIS — E6609 Other obesity due to excess calories: Secondary | ICD-10-CM

## 2020-03-23 DIAGNOSIS — F909 Attention-deficit hyperactivity disorder, unspecified type: Secondary | ICD-10-CM

## 2020-03-23 DIAGNOSIS — Z23 Encounter for immunization: Secondary | ICD-10-CM

## 2020-03-23 DIAGNOSIS — F419 Anxiety disorder, unspecified: Secondary | ICD-10-CM

## 2020-03-23 DIAGNOSIS — Z6832 Body mass index (BMI) 32.0-32.9, adult: Secondary | ICD-10-CM

## 2020-03-23 MED ORDER — LISDEXAMFETAMINE DIMESYLATE 70 MG PO CAPS: 70.0000 mg | ORAL_CAPSULE | Freq: Every day | ORAL | 0 refills | Status: DC

## 2020-03-23 NOTE — Progress Notes (Signed)
Cory Rumps, MD Phone: [phone]  Cory Harding is a 19 y.o. male who presents today for f/u.  ADHD Medication: vyvanse Effectiveness: yes Palpitations: no Sleep difficulty: no Appetite suppression: mild, notes not hungry for lunch, though this is an ongoing thing  Anxiety: Does note this is been an issue.  He just now started taking the BuSpar twice daily instead of once daily.  No depression.  History of asthma: Patient notes he has had no issues recently with this.  He is in school for body work on cars and notes he is exposed to a lot of dust particles and just wonders if there is anything specific he should do.  Obesity: Patient notes he is had quite a bit of weight gain recently.  His food choices have not been good.  He does not like vegetables.  He eats lots of rice, pizza, and pasta.  He is also been drinking 1-2 beers every other day.  He wonders about checking testosterone level as it is just difficult for him to keep willpower up to do anything and he has a decreased sex drive.  He does note he recently started exercising 2 to 3 days a week with weights.  Social History   Tobacco Use  Smoking Status Never Smoker  Smokeless Tobacco Never Used     ROS see history of present illness  Objective  Physical Exam Vitals:   03/23/20 1422  BP: 120/80  Pulse: 78  Temp: 98.6 F (37 C)  SpO2: 97%    BP Readings from Last 3 Encounters:  03/23/20 120/80  12/17/19 105/70  05/14/19 110/70   Wt Readings from Last 3 Encounters:  03/23/20 219 lb 9.6 oz (99.6 kg)  12/17/19 212 lb 12.8 oz (96.5 kg)  08/20/19 185 lb (83.9 kg)    Physical Exam Constitutional:      General: He is not in acute distress.    Appearance: He is not diaphoretic.  Cardiovascular:     Rate and Rhythm: Normal rate and regular rhythm.     Heart sounds: Normal heart sounds.  Pulmonary:     Effort: Pulmonary effort is normal.     Breath sounds: Normal breath sounds.  Musculoskeletal:      Right lower leg: No edema.     Left lower leg: No edema.  Skin:    General: Skin is warm and dry.  Neurological:     Mental Status: He is alert.      Assessment/Plan: Please see individual problem list.  Anxiety Continues to have issues with this though he just recently started taking BuSpar as prescribed.  I encouraged him to continue with this.  Discussed that his anxiety may be playing a role with his decreased willpower and sex drive.  Discussed that we would defer testing his testosterone at this time given his age though if his overall symptoms do not improve with improvement of anxiety or his weight then we could consider testing at follow-up in 6 weeks.  We will see how he does at follow-up.  Attention deficit hyperactivity disorder (ADHD) Stable.  Slight decreased appetite in the middle of the day.  He will monitor that we will continue with his current dose of Vyvanse.  History of asthma No recent symptoms.  I discussed he should wear a mask as much as possible if he is can to be around dust particles.  If he has increasing issues with his asthma he will let us know and we could always start  him on medication for this.  Obesity Weight has trended up.  I encouraged continued exercise.  Discussed dietary changes.  Discussed decreasing carbohydrate intake and increasing vegetable intake.  Discussed that a serving size of carbohydrate is half a cup cooked carbohydrate.  Plan on follow-up in 6 weeks for his weight.    Orders Placed This Encounter  Procedures  . Flu Vaccine QUAD 36+ mos IM    Meds ordered this encounter  Medications  . lisdexamfetamine (VYVANSE) 70 MG capsule    Sig: Take 1 capsule (70 mg total) by mouth daily.    Dispense:  30 capsule    Refill:  0  . lisdexamfetamine (VYVANSE) 70 MG capsule    Sig: Take 1 capsule (70 mg total) by mouth daily.    Dispense:  30 capsule    Refill:  0    [name] was seen today for follow-up.  Diagnoses and all orders for  this visit:  Need for immunization against influenza -     Flu Vaccine QUAD 36+ mos IM  Attention deficit hyperactivity disorder (ADHD), unspecified ADHD type -     lisdexamfetamine (VYVANSE) 70 MG capsule; Take 1 capsule (70 mg total) by mouth daily.  Anxiety  History of asthma  Class 1 obesity due to excess calories without serious comorbidity with body mass index (BMI) of 32.0 to 32.9 in adult  Other orders -     lisdexamfetamine (VYVANSE) 70 MG capsule; Take 1 capsule (70 mg total) by mouth daily.     This visit occurred during the SARS-CoV-2 public health emergency.  Safety protocols were in place, including screening questions prior to the visit, additional usage of staff PPE, and extensive cleaning of exam room while observing appropriate contact time as indicated for disinfecting solutions.    Cory Rumps, MD [place]

## 2020-03-23 NOTE — Patient Instructions (Addendum)
Nice to see you. I have refilled your Vyvanse. 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 Please work on diet and exercise as we discussed.  Please decrease your portion sizes.  I have included some dietary information below. A goal calorie count to lose 1 to 2 pounds per week is between 1735 cal and 2235 cal/day  Diet Recommendations  Starchy (carb) foods: Bread, rice, pasta, potatoes, corn, cereal, grits, crackers, bagels, muffins, all baked goods.  (Fruits, milk, and yogurt also have carbohydrate, but most of these foods will not spike your blood sugar as the starchy foods will.)  A few fruits do cause high blood sugars; use small portions of bananas (limit to 1/2 at a time), grapes, watermelon, oranges, and most tropical fruits.    Protein foods: Meat, fish, poultry, eggs, dairy foods, and beans such as pinto and kidney beans (beans also provide carbohydrate).   1. Eat at least 3 meals and 1-2 snacks per day. Never go more than 4-5 hours while awake without eating. Eat breakfast within the first hour of getting up.   2. Limit starchy foods to TWO per meal and ONE per snack. ONE portion of a starchy  food is equal to the following:   - ONE slice of bread (or its equivalent, such as half of a hamburger bun).   - 1/2 cup of a "scoopable" starchy food such as potatoes or rice.   - 15 grams of carbohydrate as shown on food label.  3. Include at every meal: a protein food, a carb food, and vegetables and/or fruit.   - Obtain twice the volume of veg's as protein or carbohydrate foods for both lunch and dinner.   - Fresh or frozen veg's are best.   - Keep frozen veg's on hand for a quick vegetable serving.

## 2020-03-24 NOTE — Assessment & Plan Note (Addendum)
Continues to have issues with this though he just recently started taking BuSpar as prescribed.  I encouraged him to continue with this.  Discussed that his anxiety may be playing a role with his decreased willpower and sex drive.  Discussed that we would defer testing his testosterone at this time given his age though if his overall symptoms do not improve with improvement of anxiety or his weight then we could consider testing at follow-up in 6 weeks.  We will see how he does at follow-up.

## 2020-03-24 NOTE — Assessment & Plan Note (Signed)
No recent symptoms.  I discussed he should wear a mask as much as possible if he is can to be around dust particles.  If he has increasing issues with his asthma he will let us know and we could always start him on medication for this.

## 2020-03-24 NOTE — Assessment & Plan Note (Signed)
Stable.  Slight decreased appetite in the middle of the day.  He will monitor that we will continue with his current dose of Vyvanse.

## 2020-03-24 NOTE — Assessment & Plan Note (Signed)
Weight has trended up.  I encouraged continued exercise.  Discussed dietary changes.  Discussed decreasing carbohydrate intake and increasing vegetable intake.  Discussed that a serving size of carbohydrate is half a cup cooked carbohydrate.  Plan on follow-up in 6 weeks for his weight.

## 2020-03-27 ENCOUNTER — Ambulatory Visit (INDEPENDENT_AMBULATORY_CARE_PROVIDER_SITE_OTHER): Payer: BC Managed Care – PPO | Admitting: Psychology

## 2020-03-27 DIAGNOSIS — F4310 Post-traumatic stress disorder, unspecified: Secondary | ICD-10-CM

## 2020-04-03 ENCOUNTER — Ambulatory Visit (INDEPENDENT_AMBULATORY_CARE_PROVIDER_SITE_OTHER): Payer: BC Managed Care – PPO | Admitting: Psychology

## 2020-04-03 DIAGNOSIS — F411 Generalized anxiety disorder: Secondary | ICD-10-CM

## 2020-04-10 ENCOUNTER — Ambulatory Visit (INDEPENDENT_AMBULATORY_CARE_PROVIDER_SITE_OTHER): Payer: BC Managed Care – PPO | Admitting: Psychology

## 2020-04-10 DIAGNOSIS — F4310 Post-traumatic stress disorder, unspecified: Secondary | ICD-10-CM | POA: Diagnosis not present

## 2020-04-24 ENCOUNTER — Ambulatory Visit (INDEPENDENT_AMBULATORY_CARE_PROVIDER_SITE_OTHER): Payer: BC Managed Care – PPO | Admitting: Psychology

## 2020-04-24 DIAGNOSIS — F4310 Post-traumatic stress disorder, unspecified: Secondary | ICD-10-CM | POA: Diagnosis not present

## 2020-05-08 ENCOUNTER — Ambulatory Visit: Payer: BC Managed Care – PPO | Admitting: Family Medicine

## 2020-05-08 ENCOUNTER — Encounter: Payer: Self-pay | Admitting: Family Medicine

## 2020-05-08 ENCOUNTER — Other Ambulatory Visit: Payer: Self-pay

## 2020-05-08 DIAGNOSIS — F419 Anxiety disorder, unspecified: Secondary | ICD-10-CM

## 2020-05-08 DIAGNOSIS — E6609 Other obesity due to excess calories: Secondary | ICD-10-CM | POA: Diagnosis not present

## 2020-05-08 DIAGNOSIS — Z6832 Body mass index (BMI) 32.0-32.9, adult: Secondary | ICD-10-CM

## 2020-05-08 DIAGNOSIS — M545 Low back pain, unspecified: Secondary | ICD-10-CM | POA: Diagnosis not present

## 2020-05-08 MED ORDER — OMEPRAZOLE 20 MG PO CPDR
20.0000 mg | DELAYED_RELEASE_CAPSULE | Freq: Every day | ORAL | 3 refills | Status: DC
Start: 2020-05-08 — End: 2020-05-18

## 2020-05-08 NOTE — Assessment & Plan Note (Signed)
Weight has trended down on his home scale.  He will continue with dietary changes and exercise.  Discussed cutting down on fried fatty foods and sweets.  Encouraged continued water intake.  Discussed adding in slightly more intense exercise.

## 2020-05-08 NOTE — Assessment & Plan Note (Signed)
Recent onset back pain.  Suspect muscular strain.  Given exercises to complete.  If not improving over the next 4 to 6 weeks to let us know.  He will also let us know if it worsens at any point.

## 2020-05-08 NOTE — Patient Instructions (Signed)
Nice to see you. Please continue with diet and exercise as we discussed. Please do the exercises for your back.   Back Exercises These exercises help to make your trunk and back strong. They also help to keep the lower back flexible. Doing these exercises can help to prevent back pain or lessen existing pain.  If you have back pain, try to do these exercises 2-3 times each day or as told by your doctor.  As you get better, do the exercises once each day. Repeat the exercises more often as told by your doctor.  To stop back pain from coming back, do the exercises once each day, or as told by your doctor. Exercises Single knee to chest Do these steps 3-5 times in a row for each leg: 1. Lie on your back on a firm bed or the floor with your legs stretched out. 2. Bring one knee to your chest. 3. Grab your knee or thigh with both hands and hold them it in place. 4. Pull on your knee until you feel a gentle stretch in your lower back or buttocks. 5. Keep doing the stretch for 10-30 seconds. 6. Slowly let go of your leg and straighten it. Pelvic tilt Do these steps 5-10 times in a row: 1. Lie on your back on a firm bed or the floor with your legs stretched out. 2. Bend your knees so they point up to the ceiling. Your feet should be flat on the floor. 3. Tighten your lower belly (abdomen) muscles to press your lower back against the floor. This will make your tailbone point up to the ceiling instead of pointing down to your feet or the floor. 4. Stay in this position for 5-10 seconds while you gently tighten your muscles and breathe evenly. Cat-cow Do these steps until your lower back bends more easily: 1. Get on your hands and knees on a firm surface. Keep your hands under your shoulders, and keep your knees under your hips. You may put padding under your knees. 2. Let your head hang down toward your chest. Tighten (contract) the muscles in your belly. Point your tailbone toward the floor so  your lower back becomes rounded like the back of a cat. 3. Stay in this position for 5 seconds. 4. Slowly lift your head. Let the muscles of your belly relax. Point your tailbone up toward the ceiling so your back forms a sagging arch like the back of a cow. 5. Stay in this position for 5 seconds.  Press-ups Do these steps 5-10 times in a row: 1. Lie on your belly (face-down) on the floor. 2. Place your hands near your head, about shoulder-width apart. 3. While you keep your back relaxed and keep your hips on the floor, slowly straighten your arms to raise the top half of your body and lift your shoulders. Do not use your back muscles. You may change where you place your hands in order to make yourself more comfortable. 4. Stay in this position for 5 seconds. 5. Slowly return to lying flat on the floor.  Bridges Do these steps 10 times in a row: 1. Lie on your back on a firm surface. 2. Bend your knees so they point up to the ceiling. Your feet should be flat on the floor. Your arms should be flat at your sides, next to your body. 3. Tighten your butt muscles and lift your butt off the floor until your waist is almost as high as your knees.  If you do not feel the muscles working in your butt and the back of your thighs, slide your feet 1-2 inches farther away from your butt. 4. Stay in this position for 3-5 seconds. 5. Slowly lower your butt to the floor, and let your butt muscles relax. If this exercise is too easy, try doing it with your arms crossed over your chest. Belly crunches Do these steps 5-10 times in a row: 1. Lie on your back on a firm bed or the floor with your legs stretched out. 2. Bend your knees so they point up to the ceiling. Your feet should be flat on the floor. 3. Cross your arms over your chest. 4. Tip your chin a little bit toward your chest but do not bend your neck. 5. Tighten your belly muscles and slowly raise your chest just enough to lift your shoulder blades  a tiny bit off of the floor. Avoid raising your body higher than that, because it can put too much stress on your low back. 6. Slowly lower your chest and your head to the floor. Back lifts Do these steps 5-10 times in a row: 1. Lie on your belly (face-down) with your arms at your sides, and rest your forehead on the floor. 2. Tighten the muscles in your legs and your butt. 3. Slowly lift your chest off of the floor while you keep your hips on the floor. Keep the back of your head in line with the curve in your back. Look at the floor while you do this. 4. Stay in this position for 3-5 seconds. 5. Slowly lower your chest and your face to the floor. Contact a doctor if:  Your back pain gets a lot worse when you do an exercise.  Your back pain does not get better 2 hours after you exercise. If you have any of these problems, stop doing the exercises. Do not do them again unless your doctor says it is okay. Get help right away if:  You have sudden, very bad back pain. If this happens, stop doing the exercises. Do not do them again unless your doctor says it is okay. This information is not intended to replace advice given to you by your health care provider. Make sure you discuss any questions you have with your health care provider. Document Revised: 03/15/2018 Document Reviewed: 03/15/2018 Elsevier Patient Education  2020 [employer].

## 2020-05-08 NOTE — Assessment & Plan Note (Signed)
Improving.  He will continue BuSpar 10 mg twice daily.

## 2020-05-08 NOTE — Progress Notes (Signed)
[name], MD Phone: [phone]  Cory Harding is a 19 y.o. male who presents today for f/u.    Obesity: Patient notes he is down 5 pounds since our last visit on his home scale.  He has decreased his portion sizes significantly.  He started walking when playing golf.  He is going back to work in December and notes that will be more active.  He is drinking 64 ounces of water daily.  Half a can of soda daily.  Anxiety: His is quite a bit better.  He is taking buspirone twice daily.  No depression.  Low libido in sexual partner: Patient wonders about a referral for sex therapy.  The patient notes no libido issues in himself.  Back pain: Patient notes his entire paraspinous muscle area bilaterally has been aching on occasion over the last 5 days.  He notes no injury.  Bothers him more in the morning.  No radiation, numbness, or weakness.  Social History   Tobacco Use  Smoking Status Never Smoker  Smokeless Tobacco Never Used     ROS see history of present illness  Objective  Physical Exam Vitals:   05/08/20 1534  BP: 110/80  Pulse: 74  Temp: 98.4 F (36.9 C)  SpO2: 99%    BP Readings from Last 3 Encounters:  05/08/20 110/80  03/23/20 120/80  12/17/19 105/70   Wt Readings from Last 3 Encounters:  05/08/20 218 lb (98.9 kg)  03/23/20 219 lb 9.6 oz (99.6 kg)  12/17/19 212 lb 12.8 oz (96.5 kg)    Physical Exam Constitutional:      General: He is not in acute distress.    Appearance: He is not diaphoretic.  Cardiovascular:     Rate and Rhythm: Normal rate and regular rhythm.     Heart sounds: Normal heart sounds.  Pulmonary:     Effort: Pulmonary effort is normal.     Breath sounds: Normal breath sounds.  Musculoskeletal:     Comments: No midline spine tenderness, no midline spine step-off, no muscular back tenderness  Skin:    General: Skin is warm and dry.  Neurological:     Mental Status: He is alert.     Comments: 5/5 strength in bilateral biceps,  triceps, grip, quads, hamstrings, plantar and dorsiflexion, sensation to light touch intact in bilateral UE and LE, normal gait      Assessment/Plan: Please see individual problem list.  Problem List Items Addressed This Visit    Anxiety    Improving.  He will continue BuSpar 10 mg twice daily.      Low back pain    Recent onset back pain.  Suspect muscular strain.  Given exercises to complete.  If not improving over the next 4 to 6 weeks to let us know.  He will also let us know if it worsens at any point.      Obesity    Weight has trended down on his home scale.  He will continue with dietary changes and exercise.  Discussed cutting down on fried fatty foods and sweets.  Encouraged continued water intake.  Discussed adding in slightly more intense exercise.       I encouraged the patient to check with his insurance and have his partner check with their insurance to see if they cover therapy and sex therapy.  Discussed that his partner would have to request the referral as it is related to her sexual dysfunction.   This visit occurred during the SARS-CoV-2  public health emergency.  Safety protocols were in place, including screening questions prior to the visit, additional usage of staff PPE, and extensive cleaning of exam room while observing appropriate contact time as indicated for disinfecting solutions.    [name], MD [place]

## 2020-05-11 ENCOUNTER — Ambulatory Visit (INDEPENDENT_AMBULATORY_CARE_PROVIDER_SITE_OTHER): Payer: BC Managed Care – PPO | Admitting: Psychology

## 2020-05-11 DIAGNOSIS — F4310 Post-traumatic stress disorder, unspecified: Secondary | ICD-10-CM

## 2020-05-18 ENCOUNTER — Ambulatory Visit (INDEPENDENT_AMBULATORY_CARE_PROVIDER_SITE_OTHER): Payer: BC Managed Care – PPO | Admitting: Psychology

## 2020-05-18 ENCOUNTER — Other Ambulatory Visit: Payer: Self-pay

## 2020-05-18 DIAGNOSIS — K219 Gastro-esophageal reflux disease without esophagitis: Secondary | ICD-10-CM

## 2020-05-18 DIAGNOSIS — F4310 Post-traumatic stress disorder, unspecified: Secondary | ICD-10-CM

## 2020-05-18 MED ORDER — OMEPRAZOLE 20 MG PO CPDR: 20.0000 mg | DELAYED_RELEASE_CAPSULE | Freq: Every day | ORAL | 3 refills | Status: DC

## 2020-05-25 ENCOUNTER — Ambulatory Visit (INDEPENDENT_AMBULATORY_CARE_PROVIDER_SITE_OTHER): Payer: BC Managed Care – PPO | Admitting: Psychology

## 2020-05-25 DIAGNOSIS — F4310 Post-traumatic stress disorder, unspecified: Secondary | ICD-10-CM | POA: Diagnosis not present

## 2020-06-01 ENCOUNTER — Ambulatory Visit: Payer: BC Managed Care – PPO | Admitting: Psychology

## 2020-06-02 ENCOUNTER — Telehealth: Payer: BC Managed Care – PPO | Admitting: Emergency Medicine

## 2020-06-02 DIAGNOSIS — R070 Pain in throat: Secondary | ICD-10-CM | POA: Diagnosis not present

## 2020-06-02 MED ORDER — CLOTRIMAZOLE 10 MG MT TROC: 10.0000 mg | Freq: Every day | OROMUCOSAL | 0 refills | Status: DC

## 2020-06-02 MED ORDER — LIDOCAINE VISCOUS HCL 2 % MT SOLN: 15.0000 mL | OROMUCOSAL | 0 refills | Status: DC | PRN

## 2020-06-02 NOTE — Progress Notes (Signed)
Time spent: 10 min  We are sorry that you are not feeling well.  Here is how we plan to help!  Your symptoms indicate a likely viral infection (Pharyngitis).   Pharyngitis is inflammation in the back of the throat which can cause a sore throat, scratchiness and sometimes difficulty swallowing.   Pharyngitis is typically caused by a respiratory virus and will just run its course.  Please keep in mind that your symptoms could last up to 10 days.  For throat pain, we recommend over the counter oral pain relief medications such as acetaminophen or aspirin, or anti-inflammatory medications such as ibuprofen or naproxen sodium.  Topical treatments such as oral throat lozenges or sprays may be used as needed.  Avoid close contact with loved ones, especially the very young and elderly.  Remember to wash your hands thoroughly throughout the day as this is the number one way to prevent the spread of infection and wipe down door knobs and counters with disinfectant.  After careful review of your answers, I would not recommend and antibiotic for your condition.  Antibiotics should not be used to treat conditions that we suspect are caused by viruses like the virus that causes the common cold or flu. However, some people can have Strep with atypical symptoms. You may need formal testing in clinic or office to confirm if your symptoms continue or worsen.  Providers prescribe antibiotics to treat infections caused by bacteria. Antibiotics are very powerful in treating bacterial infections when they are used properly.  To maintain their effectiveness, they should be used only when necessary.  Overuse of antibiotics has resulted in the development of super bugs that are resistant to treatment!    Thrush typically happens to people with suppressed immune systems, diabetics, etc.  It is a diagnosis based on physical exam.  It could be causing your sore throat as well, given than E-visits are limited I will prescribe  clotrimazole troches 10 mg by mouth five times daily (dissolve over 20 min). I have also prescribed lidocaine solution which you can gargle and swallow to help with throat pain as needed.  Take ibuprofen 600 mg and/or [phone] mg acetaminophen for throat pain.    Soft diet - mashed potatoes, broth, soups until you feel better  Warm liquids and 1 tbsp of diluted honey can help with inflammation in the throat  Over the counter cough medicines like Delsym or Mucinex  Home Care:  Only take medications as instructed by your medical team.  Do not drink alcohol while taking these medications.  A steam or ultrasonic humidifier can help congestion.  You can place a towel over your head and breathe in the steam from hot water coming from a faucet.  Avoid close contacts especially the very young and the elderly.  Cover your mouth when you cough or sneeze.  Always remember to wash your hands.  Get Help Right Away If:  You develop worsening fever or throat pain.  You develop a severe head ache or visual changes.  Your symptoms persist after you have completed your treatment plan.  Make sure you  Understand these instructions.  Will watch your condition.  Will get help right away if you are not doing well or get worse.  Your e-visit answers were reviewed by a board certified advanced clinical practitioner to complete your personal care plan.  Depending on the condition, your plan could have included both over the counter or prescription medications.  If there is a problem  please reply  once you have received a response from your provider.  Your safety is important to [place].  If you have drug allergies check your prescription carefully.    You can use MyChart to ask questions about todays visit, request a non-urgent call back, or ask for a work or school excuse for 24 hours related to this e-Visit. If it has been greater than 24 hours you will need to follow up with your provider, or  enter a new e-Visit to address those concerns.  You will get an e-mail in the next two days asking about your experience.  I hope that your e-visit has been valuable and will speed your recovery. Thank you for using e-visits.

## 2020-06-08 ENCOUNTER — Ambulatory Visit: Payer: BC Managed Care – PPO | Admitting: Psychology

## 2020-06-12 ENCOUNTER — Ambulatory Visit: Payer: BC Managed Care – PPO | Admitting: Family Medicine

## 2020-06-12 ENCOUNTER — Encounter: Payer: Self-pay | Admitting: Family Medicine

## 2020-06-12 ENCOUNTER — Telehealth: Payer: Self-pay | Admitting: Family Medicine

## 2020-06-12 DIAGNOSIS — F909 Attention-deficit hyperactivity disorder, unspecified type: Secondary | ICD-10-CM

## 2020-06-12 MED ORDER — LISDEXAMFETAMINE DIMESYLATE 70 MG PO CAPS: 70.0000 mg | ORAL_CAPSULE | Freq: Every day | ORAL | 0 refills | Status: DC

## 2020-06-12 NOTE — Telephone Encounter (Signed)
Patient called in to reschedule appointment need his medication refill lisdexamfetamine (VYVANSE) 70 MG capsule

## 2020-06-12 NOTE — Telephone Encounter (Signed)
Patient called in to reschedule appointment  but needs his medication refill lisdexamfetamine (VYVANSE) 70 MG capsule.  Evan Osburn,cma

## 2020-06-12 NOTE — Telephone Encounter (Signed)
Patient's mother called back checking medication for her son

## 2020-06-14 NOTE — Telephone Encounter (Signed)
Previously sent in. Please follow up with the patient to make sure he picked the medication up. Thanks.

## 2020-06-15 ENCOUNTER — Ambulatory Visit (INDEPENDENT_AMBULATORY_CARE_PROVIDER_SITE_OTHER): Payer: BC Managed Care – PPO | Admitting: Psychology

## 2020-06-15 DIAGNOSIS — F4310 Post-traumatic stress disorder, unspecified: Secondary | ICD-10-CM

## 2020-06-15 NOTE — Telephone Encounter (Signed)
LVM informing the patient that I was calling to make sure he picked up his medication (vivanse)  from the pharmacy.  Cheryll Keisler,cma

## 2020-06-22 ENCOUNTER — Ambulatory Visit (INDEPENDENT_AMBULATORY_CARE_PROVIDER_SITE_OTHER): Payer: BC Managed Care – PPO | Admitting: Psychology

## 2020-06-22 DIAGNOSIS — F4310 Post-traumatic stress disorder, unspecified: Secondary | ICD-10-CM

## 2020-06-29 ENCOUNTER — Ambulatory Visit (INDEPENDENT_AMBULATORY_CARE_PROVIDER_SITE_OTHER): Payer: BC Managed Care – PPO | Admitting: Psychology

## 2020-06-29 DIAGNOSIS — F4310 Post-traumatic stress disorder, unspecified: Secondary | ICD-10-CM | POA: Diagnosis not present

## 2020-07-02 ENCOUNTER — Encounter: Payer: Self-pay | Admitting: Family Medicine

## 2020-07-02 DIAGNOSIS — F909 Attention-deficit hyperactivity disorder, unspecified type: Secondary | ICD-10-CM

## 2020-07-06 ENCOUNTER — Ambulatory Visit: Payer: BC Managed Care – PPO | Admitting: Psychology

## 2020-07-08 NOTE — Telephone Encounter (Signed)
The 17th is fine with me and we can do it virtually. I can refill his medication when he is due for it prior to the visit.

## 2020-07-09 MED ORDER — LISDEXAMFETAMINE DIMESYLATE 70 MG PO CAPS: 70.0000 mg | ORAL_CAPSULE | Freq: Every day | ORAL | 0 refills | Status: DC

## 2020-07-13 ENCOUNTER — Ambulatory Visit: Payer: BC Managed Care – PPO | Admitting: Psychology

## 2020-07-15 ENCOUNTER — Encounter: Payer: Self-pay | Admitting: Family Medicine

## 2020-07-20 ENCOUNTER — Ambulatory Visit (INDEPENDENT_AMBULATORY_CARE_PROVIDER_SITE_OTHER): Payer: BC Managed Care – PPO | Admitting: Psychology

## 2020-07-20 ENCOUNTER — Telehealth (INDEPENDENT_AMBULATORY_CARE_PROVIDER_SITE_OTHER): Payer: BC Managed Care – PPO | Admitting: Family Medicine

## 2020-07-20 ENCOUNTER — Encounter: Payer: Self-pay | Admitting: Family Medicine

## 2020-07-20 DIAGNOSIS — Z6830 Body mass index (BMI) 30.0-30.9, adult: Secondary | ICD-10-CM

## 2020-07-20 DIAGNOSIS — F4310 Post-traumatic stress disorder, unspecified: Secondary | ICD-10-CM | POA: Diagnosis not present

## 2020-07-20 DIAGNOSIS — E6609 Other obesity due to excess calories: Secondary | ICD-10-CM | POA: Diagnosis not present

## 2020-07-20 DIAGNOSIS — F909 Attention-deficit hyperactivity disorder, unspecified type: Secondary | ICD-10-CM | POA: Diagnosis not present

## 2020-07-20 MED ORDER — LISDEXAMFETAMINE DIMESYLATE 70 MG PO CAPS: 70.0000 mg | ORAL_CAPSULE | Freq: Every day | ORAL | 0 refills | Status: DC

## 2020-07-20 NOTE — Assessment & Plan Note (Signed)
Patient's weight is trending down with dietary changes.  Discussed adequate caloric intake and advised not to drop to less than 1200 cal/day though we did discuss his prior calorie goal of around 1700 cal daily to lose 1 to 2 pounds of weight per week.  He will try to stick to that.  He will add protein to his breakfast.  He will start exercise.

## 2020-07-20 NOTE — Progress Notes (Signed)
Virtual Visit via telephone Note  This visit type was conducted due to national recommendations for restrictions regarding the COVID-19 pandemic (e.g. social distancing).  This format is felt to be most appropriate for this patient at this time.  All issues noted in this document were discussed and addressed.  No physical exam was performed (except for noted visual exam findings with Video Visits).   I connected with Cory Harding today at 10:00 AM EST by telephone and verified that I am speaking with the correct person using two identifiers. Location patient: home Location provider: home office Persons participating in the virtual visit: patient, provider  I discussed the limitations, risks, security and privacy concerns of performing an evaluation and management service by telephone and the availability of in person appointments. I also discussed with the patient that there may be a patient responsible charge related to this service. The patient expressed understanding and agreed to proceed.  Interactive audio and video telecommunications were attempted between this provider and patient, however failed, due to patient having technical difficulties OR patient did not have access to video capability.  We continued and completed visit with audio only.   Reason for visit: f/u  HPI: ADHD Medication: vyvanse 70 mg daily Effectiveness: yes Palpitations: no Sleep difficulty: no Appetite suppression: no  Obesity: Patient notes his weight is down 10 pounds since his last visit.  He started doing intermittent fasting and skipping lunch during the day.  He typically only eats cereal for breakfast.  He is more active at his new job.  He is drinking 64 ounces of water daily.  Plans on getting back in the gym in the near future.    ROS: See pertinent positives and negatives per HPI.  Past Medical History:  Diagnosis Date  . ADHD (attention deficit hyperactivity disorder)   . Asthma   . GERD  (gastroesophageal reflux disease)     No past surgical history on file.  Family History  Problem Relation Age of Onset  . Hypertension Maternal Grandfather   . Heart disease Maternal Grandfather   . Heart disease Paternal Grandfather   . Sudden death Paternal Grandfather   . Mental illness Paternal Grandfather   . Alcoholism Paternal Grandfather     SOCIAL HX: Non-smoker   Current Outpatient Medications:  .  albuterol (VENTOLIN HFA) 108 (90 Base) MCG/ACT inhaler, Inhale 2 puffs into the lungs every 6 (six) hours as needed for wheezing or shortness of breath., Disp: 18 g, Rfl: 1 .  benzonatate (TESSALON) 100 MG capsule, Take 1-2 capsules (100-200 mg total) by mouth 3 (three) times daily as needed for cough., Disp: 40 capsule, Rfl: 0 .  busPIRone (BUSPAR) 10 MG tablet, Take 1 tablet (10 mg total) by mouth 2 (two) times daily., Disp: 180 tablet, Rfl: 3 .  cetirizine (ZYRTEC) 10 MG tablet, Take 10 mg by mouth daily., Disp: , Rfl:  .  cyclobenzaprine (FLEXERIL) 10 MG tablet, Take 0.5-1 tablets (5-10 mg total) by mouth at bedtime as needed for muscle spasms., Disp: 10 tablet, Rfl: 0 .  fluticasone (FLONASE) 50 MCG/ACT nasal spray, Place 2 sprays into both nostrils daily., Disp: 16 g, Rfl: 6 .  lidocaine (XYLOCAINE) 2 % solution, Use as directed 15 mLs in the mouth or throat as needed for mouth pain., Disp: 100 mL, Rfl: 0 .  omeprazole (PRILOSEC) 20 MG capsule, Take 1 capsule (20 mg total) by mouth daily. At least 30 minutes before breakfast, Disp: 90 capsule, Rfl: 3 .  [  START ON 10/07/2020] lisdexamfetamine (VYVANSE) 70 MG capsule, Take 1 capsule (70 mg total) by mouth daily., Disp: 30 capsule, Rfl: 0 .  [START ON 09/06/2020] lisdexamfetamine (VYVANSE) 70 MG capsule, Take 1 capsule (70 mg total) by mouth daily., Disp: 30 capsule, Rfl: 0 .  [START ON 08/09/2020] lisdexamfetamine (VYVANSE) 70 MG capsule, Take 1 capsule (70 mg total) by mouth daily., Disp: 30 capsule, Rfl: 0  EXAM: This was a  telephone visit and thus no physical exam was completed.  ASSESSMENT AND PLAN:  Discussed the following assessment and plan:  Problem List Items Addressed This Visit    Attention deficit hyperactivity disorder (ADHD)    Adequate control.  Continue Vyvanse 40 mg once daily.  Refills given today.  Follow-up in 3 months.      Relevant Medications   lisdexamfetamine (VYVANSE) 70 MG capsule (Start on 10/07/2020)   lisdexamfetamine (VYVANSE) 70 MG capsule (Start on 09/06/2020)   lisdexamfetamine (VYVANSE) 70 MG capsule (Start on 08/09/2020)   Obesity    Patient's weight is trending down with dietary changes.  Discussed adequate caloric intake and advised not to drop to less than 1200 cal/day though we did discuss his prior calorie goal of around 1700 cal daily to lose 1 to 2 pounds of weight per week.  He will try to stick to that.  He will add protein to his breakfast.  He will start exercise.      Relevant Medications   lisdexamfetamine (VYVANSE) 70 MG capsule (Start on 10/07/2020)   lisdexamfetamine (VYVANSE) 70 MG capsule (Start on 09/06/2020)   lisdexamfetamine (VYVANSE) 70 MG capsule (Start on 08/09/2020)       I discussed the assessment and treatment plan with the patient. The patient was provided an opportunity to ask questions and all were answered. The patient agreed with the plan and demonstrated an understanding of the instructions.   The patient was advised to call back or seek an in-person evaluation if the symptoms worsen or if the condition fails to improve as anticipated.  I provided 9 minutes of non-face-to-face time during this encounter.   [name], MD

## 2020-07-20 NOTE — Assessment & Plan Note (Signed)
Adequate control.  Continue Vyvanse 40 mg once daily.  Refills given today.  Follow-up in 3 months.

## 2020-07-27 ENCOUNTER — Ambulatory Visit: Payer: BC Managed Care – PPO | Admitting: Psychology

## 2020-08-03 ENCOUNTER — Ambulatory Visit (INDEPENDENT_AMBULATORY_CARE_PROVIDER_SITE_OTHER): Payer: BC Managed Care – PPO | Admitting: Psychology

## 2020-08-03 DIAGNOSIS — F4310 Post-traumatic stress disorder, unspecified: Secondary | ICD-10-CM | POA: Diagnosis not present

## 2020-08-06 ENCOUNTER — Telehealth: Payer: BC Managed Care – PPO | Admitting: Orthopedic Surgery

## 2020-08-06 DIAGNOSIS — R059 Cough, unspecified: Secondary | ICD-10-CM | POA: Diagnosis not present

## 2020-08-06 MED ORDER — NAPROXEN 500 MG PO TABS: 500.0000 mg | ORAL_TABLET | Freq: Two times a day (BID) | ORAL | 0 refills | Status: DC

## 2020-08-06 MED ORDER — BENZONATATE 100 MG PO CAPS: 100.0000 mg | ORAL_CAPSULE | Freq: Three times a day (TID) | ORAL | 0 refills | Status: DC | PRN

## 2020-08-06 NOTE — Progress Notes (Signed)
E-Visit for Corona Virus Screening  Your current symptoms could be consistent with the coronavirus or flu. Most sites will test you for both. Many health care providers can now test patients at their office but not all are.  [facility] has multiple testing sites. For information on our [place] testing locations and hours go to [email or web address]  We are enrolling you in our [place] [facility] [place] . Daily you will receive a questionnaire within the [facility] website. Our COVID 19 response team will be monitoring your responses daily.  Testing Information: The COVID-19 Community Testing sites are testing BY APPOINTMENT ONLY.  You can schedule online at [email or web address]  If you do not have access to a smart phone or computer you may call [phone] for an appointment.   Additional testing sites in the Community:  . For [facility] Testing sites in [facility]  [email or web address]  . For Pop-up testing sites in [place]  [email or web address]  . For [facility] [email or web address]  . For [facility] testing in [place] and [employer] [email or web address]  . For [facility] testing in [facility]   [email or web address]  For  more information about community testing call [phone]   Please quarantine yourself while awaiting your test results. Please stay home for a minimum of 10 days from the first day of illness with improving symptoms and you have had 24 hours of no fever (without the use of Tylenol (Acetaminophen) Motrin (Ibuprofen) or any fever reducing medication).  Also - Do not get tested prior to returning to work because once you have  had a positive test the test can stay positive for more than a month in some cases.   You should wear a mask or cloth face covering over your nose and mouth if you must be around other people or animals, including pets (even at home). Try to stay at least 6 feet away from other people. This will protect the people around you.  Please continue good preventive care measures, including:  frequent hand-washing, avoid touching your face, cover coughs/sneezes, stay out of crowds and keep a 6 foot distance from others.  COVID-19 is a respiratory illness with symptoms that are similar to the flu. Symptoms are typically mild to moderate, but there have been cases of severe illness and death due to the virus.   The following symptoms may appear 2-14 days after exposure: . Fever . Cough . Shortness of breath or difficulty breathing . Chills . Repeated shaking with chills . Muscle pain . Headache . Sore throat . New loss of taste or smell . Fatigue . Congestion or runny nose . Nausea or vomiting . Diarrhea  Go to the nearest hospital ED for assessment if fever/cough/breathlessness are severe or illness seems like a threat to life.  It is vitally important that if you feel that you have an infection such as this virus or any other virus that you stay home and away from places where you may spread it to others.  You should avoid contact with people age 58 and older.   You can use medication such as A prescription cough medication called Tessalon Perles 100 mg. You may take 1-2 capsules every 8 hours as needed for cough and A prescription anti-inflammatory called Naprosyn 500 mg. Take twice daily as needed for fever or body aches for 2 weeks  You may also take acetaminophen (Tylenol) as needed for fever.  Reduce your risk of any infection by using the same precautions used for  avoiding the common cold or flu:  Marland Kitchen Wash your hands often with soap and warm water for at least 20 seconds.  If soap and water are  not readily available, use an alcohol-based hand sanitizer with at least 60% alcohol.  . If coughing or sneezing, cover your mouth and nose by coughing or sneezing into the elbow areas of your shirt or coat, into a tissue or into your sleeve (not your hands). . Avoid shaking hands with others and consider head nods or verbal greetings only. . Avoid touching your eyes, nose, or mouth with unwashed hands.  . Avoid close contact with people who are sick. . Avoid places or events with large numbers of people in one location, like concerts or sporting events. . Carefully consider travel plans you have or are making. . If you are planning any travel outside or inside the [place], visit the CDC's Travelers' Health webpage for the latest health notices. . If you have some symptoms but not all symptoms, continue to monitor at home and seek medical attention if your symptoms worsen. . If you are having a medical emergency, call 911.  HOME CARE . Only take medications as instructed by your medical team. . Drink plenty of fluids and get plenty of rest. . A steam or ultrasonic humidifier can help if you have congestion.   GET HELP RIGHT AWAY IF YOU HAVE EMERGENCY WARNING SIGNS** FOR COVID-19. If you or someone is showing any of these signs seek emergency medical care immediately. Call 911 or proceed to your closest emergency facility if: . You develop worsening high fever. . Trouble breathing . Bluish lips or face . Persistent pain or pressure in the chest . New confusion . Inability to wake or stay awake . You cough up blood. . Your symptoms become more severe  **This list is not all possible symptoms. Contact your medical provider for any symptoms that are sever or concerning to you.  MAKE SURE YOU   Understand these instructions.  Will watch your condition.  Will get help right away if you are not doing well or get worse.  Your e-visit answers were reviewed by a board certified advanced clinical  practitioner to complete your personal care plan.  Depending on the condition, your plan could have included both over the counter or prescription medications.  If there is a problem please reply once you have received a response from your provider.  Your safety is important to [place].  If you have drug allergies check your prescription carefully.    You can use MyChart to ask questions about today's visit, request a non-urgent call back, or ask for a work or school excuse for 24 hours related to this e-Visit. If it has been greater than 24 hours you will need to follow up with your provider, or enter a new e-Visit to address those concerns. You will get an e-mail in the next two days asking about your experience.  I hope that your e-visit has been valuable and will speed your recovery. Thank you for using e-visits.   Greater than 5 minutes, yet less than 10 minutes of time have been spent researching, coordinating and implementing care for this patient today.

## 2020-08-07 ENCOUNTER — Encounter: Payer: Self-pay | Admitting: Family

## 2020-08-07 ENCOUNTER — Telehealth: Payer: Self-pay

## 2020-08-07 ENCOUNTER — Encounter: Payer: Self-pay | Admitting: Family Medicine

## 2020-08-07 NOTE — Telephone Encounter (Signed)
FYI

## 2020-08-10 ENCOUNTER — Ambulatory Visit (INDEPENDENT_AMBULATORY_CARE_PROVIDER_SITE_OTHER): Payer: BC Managed Care – PPO | Admitting: Psychology

## 2020-08-10 DIAGNOSIS — F4310 Post-traumatic stress disorder, unspecified: Secondary | ICD-10-CM | POA: Diagnosis not present

## 2020-08-10 NOTE — Telephone Encounter (Signed)
Please call the patient to follow-up with him to see how he is doing today. Thanks.

## 2020-08-11 ENCOUNTER — Telehealth: Payer: BC Managed Care – PPO | Admitting: Physician Assistant

## 2020-08-11 DIAGNOSIS — U071 COVID-19: Secondary | ICD-10-CM

## 2020-08-11 NOTE — Telephone Encounter (Signed)
VM not setup.  Javier Gell,cma  

## 2020-08-11 NOTE — Telephone Encounter (Signed)
I called and spoke with the patient and he did receive the antibody infusion and he stated he is feeling much better, he has been fever free for 24 hours and he is good, I inforemed him to let us know if he needs anything else.  Travion Ke,cma

## 2020-08-11 NOTE — Progress Notes (Signed)
I have spent 5 minutes in review of e-visit questionnaire, review and updating patient chart, medical decision making and response to patient.   Tangala Wiegert Cody Philis Doke, PA-C    

## 2020-08-11 NOTE — Progress Notes (Signed)
E-Visit for Corona Virus Screening  We are sorry you are not feeling well. We are here to help!  You have tested positive for COVID-19, meaning that you were infected with the novel coronavirus and could give the virus to others.  It is vitally important that you stay home so you do not spread it to others.      Please continue isolation at home, for at least 10 days since the start of your symptoms and until you have had 24 hours with no fever (without taking a fever reducer) and with improving of symptoms.  If you have no symptoms but tested positive (or all symptoms resolve after 5 days and you have no fever) you can leave your house but continue to wear a mask around others for an additional 5 days. If you have a fever,continue to stay home until you have had 24 hours of no fever. Most cases improve 5-10 days from onset but we have seen a small number of patients who have gotten worse after the 10 days.  Please be sure to watch for worsening symptoms and remain taking the proper precautions.   Go to the nearest hospital ED for assessment if fever/cough/breathlessness are severe or illness seems like a threat to life.    The following symptoms may appear 2-14 days after exposure: . Fever . Cough . Shortness of breath or difficulty breathing . Chills . Repeated shaking with chills . Muscle pain . Headache . Sore throat . New loss of taste or smell . Fatigue . Congestion or runny nose . Nausea or vomiting . Diarrhea  You have been enrolled in [facility] for COVID-19. Daily you will receive a questionnaire within the [place] [facility] website. Our COVID-19 response team will be monitoring your responses daily.  You can use medication such as A prescription inhaler called Albuterol MDI 90 mcg /actuation 2 puffs every 4 hours as needed for shortness of breath, wheezing, cough  We have asked the monoclonal antibody team contact you to discuss the infusion with you and potentially set  this up. You should hear from them in the next 48 hours.    You may also take acetaminophen (Tylenol) as needed for fever.  HOME CARE: . Only take medications as instructed by your medical team. . Drink plenty of fluids and get plenty of rest. . A steam or ultrasonic humidifier can help if you have congestion.   GET HELP RIGHT AWAY IF YOU HAVE EMERGENCY WARNING SIGNS.  Call 911 or proceed to your closest emergency facility if: . You develop worsening high fever. . Trouble breathing . Bluish lips or face . Persistent pain or pressure in the chest . New confusion . Inability to wake or stay awake . You cough up blood. . Your symptoms become more severe . Inability to hold down food or fluids  This list is not all possible symptoms. Contact your medical provider for any symptoms that are severe or concerning to you.    Your e-visit answers were reviewed by a board certified advanced clinical practitioner to complete your personal care plan.  Depending on the condition, your plan could have included both over the counter or prescription medications.  If there is a problem please reply once you have received a response from your provider.  Your safety is important to [place].  If you have drug allergies check your prescription carefully.    You can use MyChart to ask questions about today's visit, request a non-urgent call  back, or ask for a work or school excuse for 24 hours related to this e-Visit. If it has been greater than 24 hours you will need to follow up with your provider, or enter a new e-Visit to address those concerns. You will get an e-mail in the next two days asking about your experience.  I hope that your e-visit has been valuable and will speed your recovery. Thank you for using e-visits.

## 2020-08-12 ENCOUNTER — Encounter: Payer: Self-pay | Admitting: Family Medicine

## 2020-08-12 MED ORDER — NYSTATIN 100000 UNIT/ML MT SUSP: 5.0000 mL | Freq: Four times a day (QID) | OROMUCOSAL | 0 refills | Status: DC

## 2020-08-17 ENCOUNTER — Ambulatory Visit: Payer: BC Managed Care – PPO | Admitting: Psychology

## 2020-08-24 ENCOUNTER — Ambulatory Visit: Payer: BC Managed Care – PPO | Admitting: Psychology

## 2020-08-31 ENCOUNTER — Ambulatory Visit: Payer: BC Managed Care – PPO | Admitting: Psychology

## 2020-09-02 ENCOUNTER — Ambulatory Visit (INDEPENDENT_AMBULATORY_CARE_PROVIDER_SITE_OTHER): Payer: BC Managed Care – PPO | Admitting: Psychology

## 2020-09-02 DIAGNOSIS — F4310 Post-traumatic stress disorder, unspecified: Secondary | ICD-10-CM

## 2020-09-07 ENCOUNTER — Ambulatory Visit (INDEPENDENT_AMBULATORY_CARE_PROVIDER_SITE_OTHER): Payer: BC Managed Care – PPO | Admitting: Psychology

## 2020-09-07 DIAGNOSIS — F4310 Post-traumatic stress disorder, unspecified: Secondary | ICD-10-CM | POA: Diagnosis not present

## 2020-09-14 ENCOUNTER — Ambulatory Visit (INDEPENDENT_AMBULATORY_CARE_PROVIDER_SITE_OTHER): Payer: BC Managed Care – PPO | Admitting: Psychology

## 2020-09-14 DIAGNOSIS — F4310 Post-traumatic stress disorder, unspecified: Secondary | ICD-10-CM

## 2020-09-21 ENCOUNTER — Ambulatory Visit (INDEPENDENT_AMBULATORY_CARE_PROVIDER_SITE_OTHER): Payer: BC Managed Care – PPO | Admitting: Psychology

## 2020-09-21 DIAGNOSIS — F4310 Post-traumatic stress disorder, unspecified: Secondary | ICD-10-CM

## 2020-09-28 ENCOUNTER — Ambulatory Visit (INDEPENDENT_AMBULATORY_CARE_PROVIDER_SITE_OTHER): Payer: BC Managed Care – PPO | Admitting: Psychology

## 2020-09-28 DIAGNOSIS — F4310 Post-traumatic stress disorder, unspecified: Secondary | ICD-10-CM | POA: Diagnosis not present

## 2020-10-05 ENCOUNTER — Ambulatory Visit: Payer: BC Managed Care – PPO | Admitting: Psychology

## 2020-10-06 ENCOUNTER — Ambulatory Visit: Payer: BC Managed Care – PPO | Admitting: Psychology

## 2020-10-08 ENCOUNTER — Ambulatory Visit (INDEPENDENT_AMBULATORY_CARE_PROVIDER_SITE_OTHER): Payer: BC Managed Care – PPO | Admitting: Psychology

## 2020-10-08 DIAGNOSIS — F4310 Post-traumatic stress disorder, unspecified: Secondary | ICD-10-CM | POA: Diagnosis not present

## 2020-10-12 ENCOUNTER — Ambulatory Visit: Payer: BC Managed Care – PPO | Admitting: Psychology

## 2020-10-13 ENCOUNTER — Ambulatory Visit: Payer: BC Managed Care – PPO | Admitting: Psychology

## 2020-10-15 ENCOUNTER — Ambulatory Visit: Payer: BC Managed Care – PPO | Admitting: Psychology

## 2020-10-19 ENCOUNTER — Ambulatory Visit: Payer: BC Managed Care – PPO | Admitting: Psychology

## 2020-10-20 ENCOUNTER — Ambulatory Visit (INDEPENDENT_AMBULATORY_CARE_PROVIDER_SITE_OTHER): Payer: BC Managed Care – PPO | Admitting: Psychology

## 2020-10-20 DIAGNOSIS — F4310 Post-traumatic stress disorder, unspecified: Secondary | ICD-10-CM | POA: Diagnosis not present

## 2020-10-22 ENCOUNTER — Ambulatory Visit (INDEPENDENT_AMBULATORY_CARE_PROVIDER_SITE_OTHER): Payer: BC Managed Care – PPO | Admitting: Psychology

## 2020-10-22 DIAGNOSIS — F4310 Post-traumatic stress disorder, unspecified: Secondary | ICD-10-CM | POA: Diagnosis not present

## 2020-10-23 ENCOUNTER — Other Ambulatory Visit: Payer: Self-pay

## 2020-10-23 ENCOUNTER — Ambulatory Visit: Payer: BC Managed Care – PPO | Admitting: Family Medicine

## 2020-10-23 ENCOUNTER — Encounter: Payer: Self-pay | Admitting: Family Medicine

## 2020-10-23 DIAGNOSIS — F909 Attention-deficit hyperactivity disorder, unspecified type: Secondary | ICD-10-CM

## 2020-10-23 DIAGNOSIS — E6609 Other obesity due to excess calories: Secondary | ICD-10-CM

## 2020-10-23 DIAGNOSIS — Z6834 Body mass index (BMI) 34.0-34.9, adult: Secondary | ICD-10-CM

## 2020-10-23 MED ORDER — LISDEXAMFETAMINE DIMESYLATE 70 MG PO CAPS: 70.0000 mg | ORAL_CAPSULE | Freq: Every day | ORAL | 0 refills | Status: DC

## 2020-10-23 NOTE — Assessment & Plan Note (Signed)
Stable.  Continue Vyvanse.  Refill sent in.  Controlled substance database reviewed.

## 2020-10-23 NOTE — Patient Instructions (Signed)
Nice to see you. Please work on diet and exercise as we discussed. A good calorie goal for you to lose 1 pound of weight per week is 2316 cal/day.

## 2020-10-23 NOTE — Assessment & Plan Note (Addendum)
Discussed diet and exercise.  Discussed that if he is meal prepping with chicken and rice he needs to get a vegetable and with that as well.  I encouraged him to increase his water again and he is going to try to get a gallon of water a day.  Discussed continuing weightlifting as well as cardio for exercise.  He asked about supplements and I advised in general a protein shake with whey protein is about all I would ever recommend as supplements are not regulated by the FDA and could have alternative substances in them that are not listed.  Discussed that even protein supplements could have other substances in them and he should be aware of that.  Discussed counting calories.  Advised a calorie goal of 2316 per day for 1 pound of weight loss per week with 3 days of exercise per week.  Advised never to drop less than 1200 cal a day.  I also suspect his heart rate was elevated while in the sauna due to dehydration from just having worked out and then going into a sauna.  If this recurs he will let us know.

## 2020-10-23 NOTE — Progress Notes (Signed)
[name], MD Phone: [phone]  Cory Harding is a 20 y.o. male who presents today for f/u.  ADHD Medication: vyvanse Effectiveness: yes Palpitations: no Sleep difficulty: no Appetite suppression: no  Obesity: Patient notes he got off the habit of intermittent fasting and exercising.  He also decreased his water intake.  Over the last week he has gone back to the gym and has been walking 2 miles and lifting some weights each day.  He is also going to attempt meal prepping with chicken and rice.  He does note after exercising he did get on the sauna and notes his heart rate was up on his apple watch.  Social History   Tobacco Use  Smoking Status Never Smoker  Smokeless Tobacco Never Used    Current Outpatient Medications on File Prior to Visit  Medication Sig Dispense Refill  . albuterol (VENTOLIN HFA) 108 (90 Base) MCG/ACT inhaler Inhale 2 puffs into the lungs every 6 (six) hours as needed for wheezing or shortness of breath. 18 g 1  . benzonatate (TESSALON) 100 MG capsule Take 1-2 capsules (100-200 mg total) by mouth 3 (three) times daily as needed for cough. 40 capsule 0  . benzonatate (TESSALON) 100 MG capsule Take 1 capsule (100 mg total) by mouth 3 (three) times daily as needed for cough. 20 capsule 0  . busPIRone (BUSPAR) 10 MG tablet Take 1 tablet (10 mg total) by mouth 2 (two) times daily. 180 tablet 3  . cetirizine (ZYRTEC) 10 MG tablet Take 10 mg by mouth daily.    . cyclobenzaprine (FLEXERIL) 10 MG tablet Take 0.5-1 tablets (5-10 mg total) by mouth at bedtime as needed for muscle spasms. 10 tablet 0  . fluticasone (FLONASE) 50 MCG/ACT nasal spray Place 2 sprays into both nostrils daily. 16 g 6  . lidocaine (XYLOCAINE) 2 % solution Use as directed 15 mLs in the mouth or throat as needed for mouth pain. 100 mL 0  . naproxen (NAPROSYN) 500 MG tablet Take 1 tablet (500 mg total) by mouth 2 (two) times daily with a meal. 60 tablet 0  . nystatin (MYCOSTATIN) 100000  UNIT/ML suspension Take 5 mLs (500,000 Units total) by mouth 4 (four) times daily. 180 mL 0  . omeprazole (PRILOSEC) 20 MG capsule Take 1 capsule (20 mg total) by mouth daily. At least 30 minutes before breakfast 90 capsule 3   No current facility-administered medications on file prior to visit.     ROS see history of present illness  Objective  Physical Exam Vitals:   10/23/20 1532  BP: 110/80  Pulse: 88  Temp: (!) 97.1 F (36.2 C)  SpO2: 99%    BP Readings from Last 3 Encounters:  10/23/20 110/80  05/08/20 110/80  03/23/20 120/80   Wt Readings from Last 3 Encounters:  10/23/20 232 lb (105.2 kg)  07/20/20 208 lb (94.3 kg)  05/08/20 218 lb (98.9 kg)    Physical Exam Constitutional:      General: He is not in acute distress.    Appearance: He is not diaphoretic.  Cardiovascular:     Rate and Rhythm: Normal rate and regular rhythm.     Heart sounds: Normal heart sounds.  Pulmonary:     Effort: Pulmonary effort is normal.     Breath sounds: Normal breath sounds.  Skin:    General: Skin is warm and dry.  Neurological:     Mental Status: He is alert.      Assessment/Plan: Please see individual problem  list.  Problem List Items Addressed This Visit    Attention deficit hyperactivity disorder (ADHD)    Stable.  Continue Vyvanse.  Refill sent in.  Controlled substance database reviewed.      Relevant Medications   lisdexamfetamine (VYVANSE) 70 MG capsule (Start on 01/10/2021)   lisdexamfetamine (VYVANSE) 70 MG capsule (Start on 12/11/2020)   lisdexamfetamine (VYVANSE) 70 MG capsule (Start on 11/10/2020)   Obesity    Discussed diet and exercise.  Discussed that if he is meal prepping with chicken and rice he needs to get a vegetable and with that as well.  I encouraged him to increase his water again and he is going to try to get a gallon of water a day.  Discussed continuing weightlifting as well as cardio for exercise.  He asked about supplements and I advised in  general a protein shake with whey protein is about all I would ever recommend as supplements are not regulated by the FDA and could have alternative substances in them that are not listed.  Discussed that even protein supplements could have other substances in them and he should be aware of that.  Discussed counting calories.  Advised a calorie goal of 2316 per day for 1 pound of weight loss per week with 3 days of exercise per week.  Advised never to drop less than 1200 cal a day.  I also suspect his heart rate was elevated while in the sauna due to dehydration from just having worked out and then going into a sauna.  If this recurs he will let us know.      Relevant Medications   lisdexamfetamine (VYVANSE) 70 MG capsule (Start on 01/10/2021)   lisdexamfetamine (VYVANSE) 70 MG capsule (Start on 12/11/2020)   lisdexamfetamine (VYVANSE) 70 MG capsule (Start on 11/10/2020)      This visit occurred during the SARS-CoV-2 public health emergency.  Safety protocols were in place, including screening questions prior to the visit, additional usage of staff PPE, and extensive cleaning of exam room while observing appropriate contact time as indicated for disinfecting solutions.    [name], MD [facility]

## 2020-10-26 ENCOUNTER — Ambulatory Visit: Payer: BC Managed Care – PPO | Admitting: Psychology

## 2020-10-27 ENCOUNTER — Ambulatory Visit (INDEPENDENT_AMBULATORY_CARE_PROVIDER_SITE_OTHER): Payer: BC Managed Care – PPO | Admitting: Psychology

## 2020-10-27 DIAGNOSIS — F4310 Post-traumatic stress disorder, unspecified: Secondary | ICD-10-CM

## 2020-10-29 ENCOUNTER — Ambulatory Visit: Payer: BC Managed Care – PPO | Admitting: Psychology

## 2020-11-02 ENCOUNTER — Ambulatory Visit: Payer: BC Managed Care – PPO | Admitting: Psychology

## 2020-11-03 ENCOUNTER — Ambulatory Visit (INDEPENDENT_AMBULATORY_CARE_PROVIDER_SITE_OTHER): Payer: BC Managed Care – PPO | Admitting: Psychology

## 2020-11-03 DIAGNOSIS — F4310 Post-traumatic stress disorder, unspecified: Secondary | ICD-10-CM | POA: Diagnosis not present

## 2020-11-05 ENCOUNTER — Ambulatory Visit: Payer: BC Managed Care – PPO | Admitting: Psychology

## 2020-11-09 ENCOUNTER — Ambulatory Visit: Payer: BC Managed Care – PPO | Admitting: Psychology

## 2020-11-10 ENCOUNTER — Other Ambulatory Visit: Payer: Self-pay | Admitting: Family Medicine

## 2020-11-10 ENCOUNTER — Ambulatory Visit (INDEPENDENT_AMBULATORY_CARE_PROVIDER_SITE_OTHER): Payer: BC Managed Care – PPO | Admitting: Psychology

## 2020-11-10 DIAGNOSIS — F4310 Post-traumatic stress disorder, unspecified: Secondary | ICD-10-CM | POA: Diagnosis not present

## 2020-11-12 ENCOUNTER — Ambulatory Visit: Payer: BC Managed Care – PPO | Admitting: Psychology

## 2020-11-16 ENCOUNTER — Ambulatory Visit: Payer: BC Managed Care – PPO | Admitting: Psychology

## 2020-11-17 ENCOUNTER — Ambulatory Visit (INDEPENDENT_AMBULATORY_CARE_PROVIDER_SITE_OTHER): Payer: BC Managed Care – PPO | Admitting: Psychology

## 2020-11-17 DIAGNOSIS — F4310 Post-traumatic stress disorder, unspecified: Secondary | ICD-10-CM | POA: Diagnosis not present

## 2020-11-19 ENCOUNTER — Ambulatory Visit: Payer: BC Managed Care – PPO | Admitting: Psychology

## 2020-11-23 ENCOUNTER — Ambulatory Visit: Payer: BC Managed Care – PPO | Admitting: Psychology

## 2020-11-24 ENCOUNTER — Ambulatory Visit (INDEPENDENT_AMBULATORY_CARE_PROVIDER_SITE_OTHER): Payer: BC Managed Care – PPO | Admitting: Psychology

## 2020-11-24 DIAGNOSIS — F4310 Post-traumatic stress disorder, unspecified: Secondary | ICD-10-CM | POA: Diagnosis not present

## 2020-11-26 ENCOUNTER — Ambulatory Visit: Payer: BC Managed Care – PPO | Admitting: Psychology

## 2020-12-01 ENCOUNTER — Ambulatory Visit: Payer: BC Managed Care – PPO | Admitting: Psychology

## 2020-12-03 ENCOUNTER — Ambulatory Visit: Payer: BC Managed Care – PPO | Admitting: Psychology

## 2020-12-04 ENCOUNTER — Ambulatory Visit: Payer: BC Managed Care – PPO | Admitting: Family Medicine

## 2020-12-07 ENCOUNTER — Ambulatory Visit: Payer: BC Managed Care – PPO | Admitting: Psychology

## 2020-12-08 ENCOUNTER — Ambulatory Visit (INDEPENDENT_AMBULATORY_CARE_PROVIDER_SITE_OTHER): Payer: BC Managed Care – PPO | Admitting: Psychology

## 2020-12-08 DIAGNOSIS — F4310 Post-traumatic stress disorder, unspecified: Secondary | ICD-10-CM

## 2020-12-14 ENCOUNTER — Ambulatory Visit: Payer: BC Managed Care – PPO | Admitting: Psychology

## 2020-12-15 ENCOUNTER — Ambulatory Visit: Payer: BC Managed Care – PPO | Admitting: Psychology

## 2020-12-17 ENCOUNTER — Ambulatory Visit: Payer: BC Managed Care – PPO | Admitting: Psychology

## 2020-12-21 ENCOUNTER — Ambulatory Visit: Payer: BC Managed Care – PPO | Admitting: Psychology

## 2020-12-22 ENCOUNTER — Ambulatory Visit: Payer: BC Managed Care – PPO | Admitting: Psychology

## 2020-12-24 ENCOUNTER — Ambulatory Visit: Payer: BC Managed Care – PPO | Admitting: Psychology

## 2020-12-29 ENCOUNTER — Ambulatory Visit: Payer: BC Managed Care – PPO | Admitting: Psychology

## 2020-12-31 ENCOUNTER — Ambulatory Visit: Payer: BC Managed Care – PPO | Admitting: Psychology

## 2021-01-05 ENCOUNTER — Ambulatory Visit: Payer: BC Managed Care – PPO | Admitting: Psychology

## 2021-01-07 ENCOUNTER — Ambulatory Visit: Payer: BC Managed Care – PPO | Admitting: Psychology

## 2021-01-08 ENCOUNTER — Encounter: Payer: Self-pay | Admitting: Family Medicine

## 2021-01-08 ENCOUNTER — Other Ambulatory Visit: Payer: Self-pay | Admitting: Family Medicine

## 2021-01-08 DIAGNOSIS — F909 Attention-deficit hyperactivity disorder, unspecified type: Secondary | ICD-10-CM

## 2021-01-11 NOTE — Telephone Encounter (Signed)
RX Refill:vyvanse Last Seen:10-23-20 Last ordered:01-10-21

## 2021-01-12 ENCOUNTER — Ambulatory Visit: Payer: BC Managed Care – PPO | Admitting: Psychology

## 2021-01-12 MED ORDER — LISDEXAMFETAMINE DIMESYLATE 70 MG PO CAPS: 70.0000 mg | ORAL_CAPSULE | Freq: Every day | ORAL | 0 refills | Status: DC

## 2021-01-14 ENCOUNTER — Ambulatory Visit: Payer: BC Managed Care – PPO | Admitting: Psychology

## 2021-01-19 ENCOUNTER — Ambulatory Visit: Payer: BC Managed Care – PPO | Admitting: Psychology

## 2021-01-21 ENCOUNTER — Ambulatory Visit: Payer: BC Managed Care – PPO | Admitting: Psychology

## 2021-01-22 ENCOUNTER — Ambulatory Visit: Payer: BC Managed Care – PPO | Admitting: Family Medicine

## 2021-01-22 ENCOUNTER — Other Ambulatory Visit: Payer: Self-pay

## 2021-01-22 DIAGNOSIS — F419 Anxiety disorder, unspecified: Secondary | ICD-10-CM | POA: Diagnosis not present

## 2021-01-22 DIAGNOSIS — M222X1 Patellofemoral disorders, right knee: Secondary | ICD-10-CM

## 2021-01-22 DIAGNOSIS — M222X2 Patellofemoral disorders, left knee: Secondary | ICD-10-CM

## 2021-01-22 DIAGNOSIS — K219 Gastro-esophageal reflux disease without esophagitis: Secondary | ICD-10-CM | POA: Diagnosis not present

## 2021-01-22 DIAGNOSIS — F909 Attention-deficit hyperactivity disorder, unspecified type: Secondary | ICD-10-CM

## 2021-01-22 MED ORDER — LISDEXAMFETAMINE DIMESYLATE 70 MG PO CAPS: 70.0000 mg | ORAL_CAPSULE | Freq: Every day | ORAL | 0 refills | Status: DC

## 2021-01-22 NOTE — Patient Instructions (Addendum)
Nice to see you. Please try the exercises for your knees. Avoid squats under weight. If you have pain with the exercises please stop them and let me know.  I refilled your Vyvanse.  Patellofemoral Pain Syndrome  Patellofemoral pain syndrome is a condition in which the tissue (cartilage) on the underside of the kneecap (patella) softens or breaks down. This causes pain in the front of the knee. The condition is also called runner's knee or chondromalacia patella. Patellofemoral pain syndrome is most common in young adults who are active insports. The knee is the largest joint in the body. The patella covers the front of the knee and is attached to muscles above and below the knee. The underside of the patella is covered with a smooth type of cartilage (synovium). The smooth surface helps the patella glide easily when you move your knee. Patellofemoral pain syndrome causes swelling in the joint linings and bonesurfaces in the knee. What are the causes? This condition may be caused by: Overuse of the knee. Poor alignment of your knee joints. Weak leg muscles. A direct hit to your kneecap. What increases the risk? You are more likely to develop this condition if: You do a lot of activities that can wear down your kneecap. These include: Running. Squatting. Climbing stairs. You start a new physical activity or exercise program. You wear shoes that do not fit well. You do not have good leg strength. You are overweight. What are the signs or symptoms? The main symptom of this condition is knee pain. This may feel like a dull, aching pain underneath your patella, in the front of your knee. There may be a popping or cracking sound when you move your knee. Pain may get worse with: Exercise. Climbing stairs. Running. Jumping. Squatting. Kneeling. Sitting for a long time. Moving or pushing on your patella. How is this diagnosed? This condition may be diagnosed based on: Your symptoms and  medical history. You may be asked about your recent physical activities and which ones cause knee pain. A physical exam. This may include: Moving your patella back and forth. Checking your range of knee motion. Having you squat or jump to see if you have pain. Checking the strength of your leg muscles. Imaging tests to confirm the diagnosis. These may include an MRI of your knee. How is this treated? This condition may be treated at home with rest, ice, compression, andelevation (RICE).  Other treatments may include: NSAIDs, such as ibuprofen. Physical therapy to stretch and strengthen your leg muscles. Shoe inserts (orthotics) to take stress off your knee. A knee brace or knee support. Adhesive tapes to the skin. Surgery to remove damaged cartilage or move the patella to a better position. This is rare. Follow these instructions at home: If you have a brace: Wear the brace as told by your health care provider. Remove it only as told by your health care provider. Loosen the brace if your toes tingle, become numb, or turn cold and blue. Keep the brace clean. If the brace is not waterproof: Do not let it get wet. Cover it with a watertight covering when you take a bath or a shower. Managing pain, stiffness, and swelling  If directed, put ice on the painful area. To do this: If you have a removable brace, remove it as told by your health care provider. Put ice in a plastic bag. Place a towel between your skin and the bag. Leave the ice on for 20 minutes, 2-3 times a  day. Remove the ice if your skin turns bright red. This is very important. If you cannot feel pain, heat, or cold, you have a greater risk of damage to the area. Move your toes often to reduce stiffness and swelling. Raise (elevate) the injured area above the level of your heart while you are sitting or lying down.  Activity Rest your knee. Avoid activities that cause knee pain. Perform stretching and strengthening  exercises as told by your health care provider or physical therapist. Return to your normal activities as told by your health care provider. Ask your health care provider what activities are safe for you. General instructions Take over-the-counter and prescription medicines only as told by your health care provider. Use splints, braces, knee supports, or walking aids as directed by your health care provider. Do not use any products that contain nicotine or tobacco, such as cigarettes, e-cigarettes, and chewing tobacco. These can delay healing. If you need help quitting, ask your health care provider. Keep all follow-up visits. This is important. Contact a health care provider if: Your symptoms get worse. You are not improving with home care. Summary Patellofemoral pain syndrome is a condition in which the tissue (cartilage) on the underside of the kneecap (patella) softens or breaks down. This condition causes swelling in the joint linings and bone surfaces in the knee. This leads to pain in the front of the knee. This condition may be treated at home with rest, ice, compression, and elevation (RICE). Use splints, braces, knee supports, or walking aids as directed by your health care provider. This information is not intended to replace advice given to you by your health care provider. Make sure you discuss any questions you have with your healthcare provider. Document Revised: 12/04/2019 Document Reviewed: 12/04/2019 Elsevier Patient Education  2022 [place] for Nurse [employer]itioners, 15(4), [id number]. Retrieved April 09, 2018 from http://clinicalkey.com/nursing">  Knee Exercises Ask your health care provider which exercises are safe for you. Do exercises exactly as told by your health care provider and adjust them as directed. It is normal to feel mild stretching, pulling, tightness, or discomfort as you do these exercises. Stop right away if you feel sudden pain or your pain gets  worse. Do not begin these exercises until told by your health care provider. Stretching and range-of-motion exercises These exercises warm up your muscles and joints and improve the movement and flexibility of your knee. These exercises also help to relieve pain andswelling. Knee extension, prone Lie on your abdomen (prone position) on a bed. Place your left / right knee just beyond the edge of the surface so your knee is not on the bed. You can put a towel under your left / right thigh just above your kneecap for comfort. Relax your leg muscles and allow gravity to straighten your knee (extension). You should feel a stretch behind your left / right knee. Hold this position for __________ seconds. Scoot up so your knee is supported between repetitions. Repeat __________ times. Complete this exercise __________ times a day. Knee flexion, active  Lie on your back with both legs straight. If this causes back discomfort, bend your left / right knee so your foot is flat on the floor. Slowly slide your left / right heel back toward your buttocks. Stop when you feel a gentle stretch in the front of your knee or thigh (flexion). Hold this position for __________ seconds. Slowly slide your left / right heel back to the starting position. Repeat __________  times. Complete this exercise __________ times a day. Quadriceps stretch, prone  Lie on your abdomen on a firm surface, such as a bed or padded floor. Bend your left / right knee and hold your ankle. If you cannot reach your ankle or pant leg, loop a belt around your foot and grab the belt instead. Gently pull your heel toward your buttocks. Your knee should not slide out to the side. You should feel a stretch in the front of your thigh and knee (quadriceps). Hold this position for __________ seconds. Repeat __________ times. Complete this exercise __________ times a day. Hamstring, supine Lie on your back (supine position). Loop a belt or towel  over the ball of your left / right foot. The ball of your foot is on the walking surface, right under your toes. Straighten your left / right knee and slowly pull on the belt to raise your leg until you feel a gentle stretch behind your knee (hamstring). Do not let your knee bend while you do this. Keep your other leg flat on the floor. Hold this position for __________ seconds. Repeat __________ times. Complete this exercise __________ times a day. Strengthening exercises These exercises build strength and endurance in your knee. Endurance is theability to use your muscles for a long time, even after they get tired. Quadriceps, isometric This exercise stretches the muscles in front of your thigh (quadriceps) without moving your knee joint (isometric). Lie on your back with your left / right leg extended and your other knee bent. Put a rolled towel or small pillow under your knee if told by your health care provider. Slowly tense the muscles in the front of your left / right thigh. You should see your kneecap slide up toward your hip or see increased dimpling just above the knee. This motion will push the back of the knee toward the floor. For __________ seconds, hold the muscle as tight as you can without increasing your pain. Relax the muscles slowly and completely. Repeat __________ times. Complete this exercise __________ times a day. Straight leg raises This exercise stretches the muscles in front of your thigh (quadriceps) and the muscles that move your hips (hip flexors). Lie on your back with your left / right leg extended and your other knee bent. Tense the muscles in the front of your left / right thigh. You should see your kneecap slide up or see increased dimpling just above the knee. Your thigh may even shake a bit. Keep these muscles tight as you raise your leg 4-6 inches (10-15 cm) off the floor. Do not let your knee bend. Hold this position for __________ seconds. Keep these  muscles tense as you lower your leg. Relax your muscles slowly and completely after each repetition. Repeat __________ times. Complete this exercise __________ times a day. Hamstring, isometric Lie on your back on a firm surface. Bend your left / right knee about __________ degrees. Dig your left / right heel into the surface as if you are trying to pull it toward your buttocks. Tighten the muscles in the back of your thighs (hamstring) to "dig" as hard as you can without increasing any pain. Hold this position for __________ seconds. Release the tension gradually and allow your muscles to relax completely for __________ seconds after each repetition. Repeat __________ times. Complete this exercise __________ times a day. Hamstring curls If told by your health care provider, do this exercise while wearing ankle weights. Begin with __________ lb weights. Then increase the  weight by 1 lb (0.5 kg) increments. Do not wear ankle weights that are more than __________ lb. Lie on your abdomen with your legs straight. Bend your left / right knee as far as you can without feeling pain. Keep your hips flat against the floor. Hold this position for __________ seconds. Slowly lower your leg to the starting position. Repeat __________ times. Complete this exercise __________ times a day. Squats This exercise strengthens the muscles in front of your thigh and knee (quadriceps). Stand in front of a table, with your feet and knees pointing straight ahead. You may rest your hands on the table for balance but not for support. Slowly bend your knees and lower your hips like you are going to sit in a chair. Keep your weight over your heels, not over your toes. Keep your lower legs upright so they are parallel with the table legs. Do not let your hips go lower than your knees. Do not bend lower than told by your health care provider. If your knee pain increases, do not bend as low. Hold the squat position for  __________ seconds. Slowly push with your legs to return to standing. Do not use your hands to pull yourself to standing. Repeat __________ times. Complete this exercise __________ times a day. Wall slides This exercise strengthens the muscles in front of your thigh and knee (quadriceps). Lean your back against a smooth wall or door, and walk your feet out 18-24 inches (46-61 cm) from it. Place your feet hip-width apart. Slowly slide down the wall or door until your knees bend __________ degrees. Keep your knees over your heels, not over your toes. Keep your knees in line with your hips. Hold this position for __________ seconds. Repeat __________ times. Complete this exercise __________ times a day. Straight leg raises This exercise strengthens the muscles that rotate the leg at the hip and move it away from your body (hip abductors). Lie on your side with your left / right leg in the top position. Lie so your head, shoulder, knee, and hip line up. You may bend your bottom knee to help you keep your balance. Roll your hips slightly forward so your hips are stacked directly over each other and your left / right knee is facing forward. Leading with your heel, lift your top leg 4-6 inches (10-15 cm). You should feel the muscles in your outer hip lifting. Do not let your foot drift forward. Do not let your knee roll toward the ceiling. Hold this position for __________ seconds. Slowly return your leg to the starting position. Let your muscles relax completely after each repetition. Repeat __________ times. Complete this exercise __________ times a day. Straight leg raises This exercise stretches the muscles that move your hips away from the front of the pelvis (hip extensors). Lie on your abdomen on a firm surface. You can put a pillow under your hips if that is more comfortable. Tense the muscles in your buttocks and lift your left / right leg about 4-6 inches (10-15 cm). Keep your knee  straight as you lift your leg. Hold this position for __________ seconds. Slowly lower your leg to the starting position. Let your leg relax completely after each repetition. Repeat __________ times. Complete this exercise __________ times a day. This information is not intended to replace advice given to you by your health care provider. Make sure you discuss any questions you have with your healthcare provider. Document Revised: 04/10/2018 Document Reviewed: 04/10/2018 Elsevier Patient Education  [place].

## 2021-01-22 NOTE — Assessment & Plan Note (Signed)
Stable.  Continue omeprazole 20 mg once daily.

## 2021-01-22 NOTE — Assessment & Plan Note (Signed)
Knee exercises given.  Advised not to do any squats under weight.  Advised not to do any squats of the body weight type if those are causing discomfort as well.  He was advised he could ice the area.  If this is not improving he will let me know.

## 2021-01-22 NOTE — Assessment & Plan Note (Signed)
Well-controlled.  Continue BuSpar 10 mg twice daily.

## 2021-01-22 NOTE — Progress Notes (Signed)
Cory Rumps, MD Phone: [phone]  Cory Harding is a 20 y.o. male who presents today for f/u.  ADHD Medication: vyvanse Effectiveness: yes Palpitations: no Sleep difficulty: no Appetite suppression: mild at lunch  Anxiety: Patient notes no anxiety.  No depression.  He is on BuSpar.  This has been beneficial.  GERD: Taking omeprazole.  No reflux.  No abdominal pain, blood in stool, or dysphagia.  Bilateral knee pain: This is anterior knee pain.  It hurts with going up stairs as well as doing squats.  He does squats under quite a bit of weight with being able to squat 275 pounds.  He notes no specific injury.  Notes this has been an ongoing issue prior to him doing squats under weight.   Social History   Tobacco Use  Smoking Status Never  Smokeless Tobacco Never    Current Outpatient Medications on File Prior to Visit  Medication Sig Dispense Refill   albuterol (VENTOLIN HFA) 108 (90 Base) MCG/ACT inhaler Inhale 2 puffs into the lungs every 6 (six) hours as needed for wheezing or shortness of breath. 18 g 1   busPIRone (BUSPAR) 10 MG tablet TAKE 1 TABLET TWICE A DAY 180 tablet 3   cetirizine (ZYRTEC) 10 MG tablet Take 10 mg by mouth daily.     fluticasone (FLONASE) 50 MCG/ACT nasal spray Place 2 sprays into both nostrils daily. 16 g 6   naproxen (NAPROSYN) 500 MG tablet Take 1 tablet (500 mg total) by mouth 2 (two) times daily with a meal. 60 tablet 0   omeprazole (PRILOSEC) 20 MG capsule Take 1 capsule (20 mg total) by mouth daily. At least 30 minutes before breakfast 90 capsule 3   No current facility-administered medications on file prior to visit.     ROS see history of present illness  Objective  Physical Exam Vitals:   01/22/21 1611  BP: 90/60  Pulse: 87  Temp: 99.2 F (37.3 C)  SpO2: 98%    BP Readings from Last 3 Encounters:  01/22/21 90/60  10/23/20 110/80  05/08/20 110/80   Wt Readings from Last 3 Encounters:  01/22/21 226 lb 12.8 oz (102.9  kg)  10/23/20 232 lb (105.2 kg)  07/20/20 208 lb (94.3 kg)    Physical Exam Constitutional:      General: He is not in acute distress.    Appearance: He is not diaphoretic.  Cardiovascular:     Rate and Rhythm: Normal rate and regular rhythm.     Heart sounds: Normal heart sounds.  Pulmonary:     Effort: Pulmonary effort is normal.     Breath sounds: Normal breath sounds.  Musculoskeletal:     Comments: Bilateral knees with no swelling, warmth, erythema, or tenderness, negative [place] bilaterally  Skin:    General: Skin is warm and dry.  Neurological:     Mental Status: He is alert.     Assessment/Plan: Please see individual problem list.  Problem List Items Addressed This Visit     Anxiety    Well-controlled.  Continue BuSpar 10 mg twice daily.       Attention deficit hyperactivity disorder (ADHD)    Stable.  We will monitor it has decreased appetite in the middle of the day.  He has no trouble eating breakfast or dinner.  He will continue Vyvanse 70 mg once daily.       Relevant Medications   lisdexamfetamine (VYVANSE) 70 MG capsule (Start on 04/14/2021)   lisdexamfetamine (VYVANSE) 70 MG capsule (Start  on 03/15/2021)   lisdexamfetamine (VYVANSE) 70 MG capsule (Start on 02/12/2021)   GERD (gastroesophageal reflux disease)    Stable.  Continue omeprazole 20 mg once daily.       Patellofemoral pain syndrome of both knees    Knee exercises given.  Advised not to do any squats under weight.  Advised not to do any squats of the body weight type if those are causing discomfort as well.  He was advised he could ice the area.  If this is not improving he will let me know.        Return in about 3 months (around 04/24/2021).  This visit occurred during the SARS-CoV-2 public health emergency.  Safety protocols were in place, including screening questions prior to the visit, additional usage of staff PPE, and extensive cleaning of exam room while observing appropriate  contact time as indicated for disinfecting solutions.    Cory Rumps, MD [place]

## 2021-01-22 NOTE — Assessment & Plan Note (Signed)
Stable.  We will monitor it has decreased appetite in the middle of the day.  He has no trouble eating breakfast or dinner.  He will continue Vyvanse 70 mg once daily.

## 2021-01-26 ENCOUNTER — Ambulatory Visit: Payer: BC Managed Care – PPO | Admitting: Psychology

## 2021-01-28 ENCOUNTER — Ambulatory Visit: Payer: BC Managed Care – PPO | Admitting: Psychology

## 2021-02-02 ENCOUNTER — Ambulatory Visit: Payer: BC Managed Care – PPO | Admitting: Psychology

## 2021-02-04 ENCOUNTER — Ambulatory Visit: Payer: BC Managed Care – PPO | Admitting: Psychology

## 2021-02-11 ENCOUNTER — Ambulatory Visit: Payer: BC Managed Care – PPO | Admitting: Psychology

## 2021-02-16 ENCOUNTER — Ambulatory Visit (INDEPENDENT_AMBULATORY_CARE_PROVIDER_SITE_OTHER): Payer: BC Managed Care – PPO | Admitting: Psychology

## 2021-02-16 DIAGNOSIS — F4310 Post-traumatic stress disorder, unspecified: Secondary | ICD-10-CM | POA: Diagnosis not present

## 2021-02-18 ENCOUNTER — Ambulatory Visit: Payer: BC Managed Care – PPO | Admitting: Psychology

## 2021-02-23 ENCOUNTER — Ambulatory Visit: Payer: BC Managed Care – PPO | Admitting: Psychology

## 2021-02-25 ENCOUNTER — Ambulatory Visit: Payer: BC Managed Care – PPO | Admitting: Psychology

## 2021-03-02 ENCOUNTER — Ambulatory Visit: Payer: BC Managed Care – PPO | Admitting: Psychology

## 2021-03-04 ENCOUNTER — Ambulatory Visit: Payer: BC Managed Care – PPO | Admitting: Psychology

## 2021-04-12 ENCOUNTER — Encounter: Payer: Self-pay | Admitting: Family Medicine

## 2021-04-26 ENCOUNTER — Other Ambulatory Visit: Payer: Self-pay | Admitting: Family Medicine

## 2021-04-26 DIAGNOSIS — K219 Gastro-esophageal reflux disease without esophagitis: Secondary | ICD-10-CM

## 2021-04-27 ENCOUNTER — Encounter: Payer: Self-pay | Admitting: Family Medicine

## 2021-04-27 ENCOUNTER — Ambulatory Visit: Payer: BC Managed Care – PPO | Admitting: Family Medicine

## 2021-04-29 NOTE — Telephone Encounter (Signed)
I can refill this when it is due to cover until he is able to follow-up as scheduled.

## 2021-05-12 ENCOUNTER — Encounter: Payer: Self-pay | Admitting: Family Medicine

## 2021-05-12 DIAGNOSIS — F909 Attention-deficit hyperactivity disorder, unspecified type: Secondary | ICD-10-CM

## 2021-05-12 MED ORDER — LISDEXAMFETAMINE DIMESYLATE 70 MG PO CAPS: 70.0000 mg | ORAL_CAPSULE | Freq: Every day | ORAL | 0 refills | Status: DC

## 2021-05-12 NOTE — Telephone Encounter (Signed)
Pt requests a refill on Vyvanse. Vyvanse last refilled on 04/14/21 pt last seen on 01/22/2021

## 2021-06-09 ENCOUNTER — Encounter: Payer: Self-pay | Admitting: Family Medicine

## 2021-06-09 ENCOUNTER — Ambulatory Visit: Payer: BC Managed Care – PPO | Admitting: Family Medicine

## 2021-06-09 ENCOUNTER — Other Ambulatory Visit: Payer: Self-pay

## 2021-06-09 VITALS — BP 120/80 | HR 100 | Temp 98.3°F | Ht 69.0 in | Wt 221.6 lb

## 2021-06-09 DIAGNOSIS — F909 Attention-deficit hyperactivity disorder, unspecified type: Secondary | ICD-10-CM

## 2021-06-09 DIAGNOSIS — F419 Anxiety disorder, unspecified: Secondary | ICD-10-CM | POA: Diagnosis not present

## 2021-06-09 DIAGNOSIS — Z23 Encounter for immunization: Secondary | ICD-10-CM | POA: Diagnosis not present

## 2021-06-09 NOTE — Assessment & Plan Note (Signed)
The patient's symptoms of seriousness and agitation/anger issues could be related to the Vyvanse.  These things do seem to improve when he stops the Vyvanse.  We will work on transitioning him from Vyvanse to an alternative medication.  I advised that I need to check with our clinical pharmacist to help determine if I can make a direct switch to a higher dose or if I need to titrate up.  We will let the patient know when I hear back from them.

## 2021-06-09 NOTE — Progress Notes (Signed)
Cory Rumps, MD Phone: [phone]  DEYTON ELLENBECKER is a 20 y.o. male who presents today for follow-up.  ADHD/anxiety: Patient has been on Vyvanse for many years.  He notes more recently he has felt like he has been acting very seriously well on the Vyvanse.  Notes when he does not take the Vyvanse that seriousness goes away.  He also has noticed some lack of focus while taking the Vyvanse.  He has noted some anger at times as well.  He notes his anxiety is pretty stable and well-controlled.  He notes no significant depression.  He is taking BuSpar.  Social History   Tobacco Use  Smoking Status Never  Smokeless Tobacco Never    Current Outpatient Medications on File Prior to Visit  Medication Sig Dispense Refill   albuterol (VENTOLIN HFA) 108 (90 Base) MCG/ACT inhaler Inhale 2 puffs into the lungs every 6 (six) hours as needed for wheezing or shortness of breath. 18 g 1   busPIRone (BUSPAR) 10 MG tablet TAKE 1 TABLET TWICE A DAY 180 tablet 3   cetirizine (ZYRTEC) 10 MG tablet Take 10 mg by mouth daily.     fluticasone (FLONASE) 50 MCG/ACT nasal spray Place 2 sprays into both nostrils daily. 16 g 6   lisdexamfetamine (VYVANSE) 70 MG capsule Take 1 capsule (70 mg total) by mouth daily. 30 capsule 0   lisdexamfetamine (VYVANSE) 70 MG capsule Take 1 capsule (70 mg total) by mouth daily. 30 capsule 0   lisdexamfetamine (VYVANSE) 70 MG capsule Take 1 capsule (70 mg total) by mouth daily. 30 capsule 0   naproxen (NAPROSYN) 500 MG tablet Take 1 tablet (500 mg total) by mouth 2 (two) times daily with a meal. 60 tablet 0   omeprazole (PRILOSEC) 20 MG capsule TAKE 1 CAPSULE DAILY AT LEAST 30 MINUTES BEFORE BREAKFAST 90 capsule 3   No current facility-administered medications on file prior to visit.     ROS see history of present illness  Objective  Physical Exam Vitals:   06/09/21 1541  BP: 120/80  Pulse: 100  Temp: 98.3 F (36.8 C)  SpO2: 98%    BP Readings from Last 3  Encounters:  06/09/21 120/80  01/22/21 90/60  10/23/20 110/80   Wt Readings from Last 3 Encounters:  06/09/21 221 lb 9.6 oz (100.5 kg)  01/22/21 226 lb 12.8 oz (102.9 kg)  10/23/20 232 lb (105.2 kg)    Physical Exam Constitutional:      General: He is not in acute distress.    Appearance: He is not diaphoretic.  Cardiovascular:     Rate and Rhythm: Normal rate and regular rhythm.     Heart sounds: Normal heart sounds.  Pulmonary:     Effort: Pulmonary effort is normal.     Breath sounds: Normal breath sounds.  Skin:    General: Skin is warm and dry.  Neurological:     Mental Status: He is alert.     Assessment/Plan: Please see individual problem list.  Problem List Items Addressed This Visit     Anxiety    Generally stable and well-controlled.  He will continue BuSpar 10 mg twice daily.      Attention deficit hyperactivity disorder (ADHD)    The patient's symptoms of seriousness and agitation/anger issues could be related to the Vyvanse.  These things do seem to improve when he stops the Vyvanse.  We will work on transitioning him from Vyvanse to an alternative medication.  I advised that I need  to check with our clinical pharmacist to help determine if I can make a direct switch to a higher dose or if I need to titrate up.  We will let the patient know when I hear back from them.        Return in about 3 months (around 09/07/2021).  This visit occurred during the SARS-CoV-2 public health emergency.  Safety protocols were in place, including screening questions prior to the visit, additional usage of staff PPE, and extensive cleaning of exam room while observing appropriate contact time as indicated for disinfecting solutions.    Cory Rumps, MD [place]

## 2021-06-09 NOTE — Assessment & Plan Note (Signed)
Generally stable and well-controlled.  He will continue BuSpar 10 mg twice daily.

## 2021-06-09 NOTE — Patient Instructions (Signed)
We will call you when we hear back from our pharmacist regarding making the change from Vyvanse to a different medication.

## 2021-06-11 ENCOUNTER — Telehealth: Payer: Self-pay | Admitting: Family Medicine

## 2021-06-11 ENCOUNTER — Encounter: Payer: Self-pay | Admitting: Family Medicine

## 2021-06-11 MED ORDER — METHYLPHENIDATE HCL ER (OSM) 36 MG PO TBCR: 36.0000 mg | EXTENDED_RELEASE_TABLET | Freq: Every day | ORAL | 0 refills | Status: DC

## 2021-06-11 NOTE — Telephone Encounter (Signed)
LVM for patient to call back.   Nomar Broad,cma  

## 2021-06-11 NOTE — Telephone Encounter (Signed)
-----   Message from De Hollingshead, [place] sent at 06/10/2021  7:54 AM EST ----- Hey!  I'd start at a lower dose, maybe not lowest. I'd advise starting Concerta at 36 mg daily. If significant lack of benefit after about a week, you can increase to 54 mg.   Catie ----- Message ----- From: Leone Haven, MD Sent: 06/09/2021   3:55 PM EST To: De Hollingshead, RPH-CPP  Hey,   This patient is on vyvanse and he has been having some likely side effects (increased seriousness, agitation) and also loss of focus. I want to switch him to concerta to see if he will tolerate that a little better. He is on vyvanse 70 mg daily. Do I need to start from the lowest dose of the concerta or can I switch directly to a higher dose of concerta given that he has been on the highest dose of vyvanse? Thanks.   Randall Hiss

## 2021-06-11 NOTE — Telephone Encounter (Signed)
Please let the patient know that I sent in concerta for him to start on. If possible could he come in to see me on 06/25/21 to follow-up on this so we can make sure this is working ok for him?
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
## 2021-06-14 MED ORDER — METHYLPHENIDATE HCL ER (OSM) 36 MG PO TBCR: 36.0000 mg | EXTENDED_RELEASE_TABLET | Freq: Every day | ORAL | 0 refills | Status: DC

## 2021-06-14 NOTE — Telephone Encounter (Signed)
Pt mother called in stating that the local pharmacy advise Pt that medication (methylphenidate (CONCERTA) 36 MG PO CR tablet) is out of stock. Pt mother stated that the insurance company advise pharmacy that insurance won't cover the brand name of the medication. Pt mother stated that the pharmacy advise her "if the doctor can send over another script for medication (methylphenidate 36mg  XR) they would pay for it. Pt requesting callback."

## 2021-06-14 NOTE — Telephone Encounter (Signed)
Generic methylphenidate sent to pharmacy.

## 2021-06-15 ENCOUNTER — Telehealth: Payer: Self-pay

## 2021-06-16 MED ORDER — AMPHETAMINE-DEXTROAMPHET ER 20 MG PO CP24: 20.0000 mg | ORAL_CAPSULE | Freq: Every day | ORAL | 0 refills | Status: DC

## 2021-06-16 NOTE — Telephone Encounter (Signed)
Noted. I sent in Adderall XR 20 mg once daily for him to start on.

## 2021-07-16 ENCOUNTER — Other Ambulatory Visit: Payer: Self-pay | Admitting: Family Medicine

## 2021-07-16 ENCOUNTER — Encounter: Payer: Self-pay | Admitting: Family Medicine

## 2021-07-16 MED ORDER — AMPHETAMINE-DEXTROAMPHET ER 20 MG PO CP24: 20.0000 mg | ORAL_CAPSULE | Freq: Every day | ORAL | 0 refills | Status: DC

## 2021-07-16 NOTE — Telephone Encounter (Signed)
Pt called in request medication refill on (amphetamine-dextroamphetamine (ADDERALL XR) 20 MG 24 hr capsule).

## 2021-07-23 ENCOUNTER — Telehealth (INDEPENDENT_AMBULATORY_CARE_PROVIDER_SITE_OTHER): Payer: BC Managed Care – PPO | Admitting: Nurse Practitioner

## 2021-07-23 ENCOUNTER — Encounter: Payer: Self-pay | Admitting: Family Medicine

## 2021-07-23 VITALS — HR 87

## 2021-07-23 DIAGNOSIS — B370 Candidal stomatitis: Secondary | ICD-10-CM

## 2021-07-23 MED ORDER — NYSTATIN 100000 UNIT/ML MT SUSP: 5.0000 mL | Freq: Four times a day (QID) | OROMUCOSAL | 0 refills | Status: AC

## 2021-07-23 NOTE — Progress Notes (Signed)
Patient ID: Cory Harding, male    DOB: 12/25/2000, 21 y.o.   MRN: [id number]  Virtual visit completed through [place], a video enabled telemedicine application. Due to national recommendations of social distancing due to COVID-19, a virtual visit is felt to be most appropriate for this patient at this time. Reviewed limitations, risks, security and privacy concerns of performing a virtual visit and the availability of in person appointments. I also reviewed that there may be a patient responsible charge related to this service. The patient agreed to proceed.   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    Pulse 87 Comment: per patient   CC: Thrush Subjective:   HPI: Cory Harding is a 21 y.o. male presenting on 07/23/2021 for mouth problem (Started about 2 days ago. Dry mouth, white color on tongue and roof of the mouth, some burning sensation of the mouth with drinking carbonated liquid. Has had mouth thrush in the past several times. Getting over a cold and has taking a lot of cough drops for sore throat.)  Symptoms started about 2 days ago White patches on tongue, burning sensation to mouth. States that he has a history of thrush states that cough drops are the culprit, as far as he can tell.  Did ask if he had been checked or dx with DM or HIV. He states he has neither dx.   Relevant past medical, surgical, family and social history reviewed and updated as indicated. Interim medical history since our last visit reviewed. Allergies and medications reviewed and updated. Outpatient Medications Prior to Visit  Medication Sig Dispense Refill   albuterol (VENTOLIN HFA) 108 (90 Base) MCG/ACT inhaler Inhale 2 puffs into the lungs every 6 (six) hours as needed for wheezing or shortness of breath.  18 g 1   amphetamine-dextroamphetamine (ADDERALL XR) 20 MG 24 hr capsule Take 1 capsule (20 mg total) by mouth daily. 30 capsule 0   busPIRone (BUSPAR) 10 MG tablet TAKE 1 TABLET TWICE A DAY 180 tablet 3   cetirizine (ZYRTEC) 10 MG tablet Take 10 mg by mouth daily.     fluticasone (FLONASE) 50 MCG/ACT nasal spray Place 2 sprays into both nostrils daily. 16 g 6   omeprazole (PRILOSEC) 20 MG capsule TAKE 1 CAPSULE DAILY AT LEAST 30 MINUTES BEFORE BREAKFAST 90 capsule 3   naproxen (NAPROSYN) 500 MG tablet Take 1 tablet (500 mg total) by mouth 2 (two) times daily with a meal. 60 tablet 0   No facility-administered medications prior to visit.     Per HPI unless specifically indicated in ROS section below Review of Systems Objective:  Pulse 87 Comment: per patient  Wt Readings from Last 3 Encounters:  06/09/21 221 lb 9.6 oz (100.5 kg)  01/22/21 226 lb 12.8 oz (102.9 kg)  10/23/20 232 lb (105.2 kg)       Physical exam: Gen: alert, NAD, not ill appearing Pulm: speaks in complete sentences without increased work of breathing Psych: normal mood, normal thought content      Results for orders placed or performed in visit on 08/23/18  GC/CT Probe, Amp (Throat)   Specimen: Throat  Result Value Ref Range   Chlamydia trachomatis RNA Not Detected Not Detect   Neisseria gonorrhoeae RNA Not Detected Not Detect  POCT rapid strep A  Result Value  Ref Range   Rapid Strep A Screen Negative Negative   Assessment & Plan:   Problem List Items Addressed This Visit       Digestive   Thrush - Primary    Patient has history of the same.  States this is similar to episodes in the past.  Did look he has been tested for HIV several years ago that was negative.  Do not see any screening for diabetes.  Will start on nystatin 4 times daily for 7 days.  Does not improve he needs to follow-up in office with primary care provider patient knowledge      Relevant Medications   nystatin (MYCOSTATIN) 100000  UNIT/ML suspension     Meds ordered this encounter  Medications   nystatin (MYCOSTATIN) 100000 UNIT/ML suspension    Sig: Take 5 mLs (500,000 Units total) by mouth 4 (four) times daily for 7 days.    Dispense:  60 mL    Refill:  0    Order Specific Question:   Supervising Provider    Answer:   TOWER, MARNE A [[id number]]   No orders of the defined types were placed in this encounter.   I discussed the assessment and treatment plan with the patient. The patient was provided an opportunity to ask questions and all were answered. The patient agreed with the plan and demonstrated an understanding of the instructions. The patient was advised to call back or seek an in-person evaluation if the symptoms worsen or if the condition fails to improve as anticipated.  Follow up plan: No follow-ups on file.  Romilda Garret, NP

## 2021-07-23 NOTE — Telephone Encounter (Signed)
I called and spoke with patient & explained how to do VV. He stated that he had done one before but it had been awhile. He is scheduled with Karl Ito, NP at 3p.

## 2021-07-23 NOTE — Assessment & Plan Note (Signed)
Patient has history of the same.  States this is similar to episodes in the past.  Did look he has been tested for HIV several years ago that was negative.  Do not see any screening for diabetes.  Will start on nystatin 4 times daily for 7 days.  Does not improve he needs to follow-up in office with primary care provider patient knowledge

## 2021-08-16 ENCOUNTER — Encounter: Payer: Self-pay | Admitting: Family Medicine

## 2021-08-16 MED ORDER — AMPHETAMINE-DEXTROAMPHET ER 20 MG PO CP24: 20.0000 mg | ORAL_CAPSULE | Freq: Every day | ORAL | 0 refills | Status: DC

## 2021-09-10 ENCOUNTER — Other Ambulatory Visit: Payer: Self-pay

## 2021-09-10 ENCOUNTER — Encounter: Payer: Self-pay | Admitting: Family Medicine

## 2021-09-10 ENCOUNTER — Ambulatory Visit: Payer: BC Managed Care – PPO | Admitting: Family Medicine

## 2021-09-10 DIAGNOSIS — J309 Allergic rhinitis, unspecified: Secondary | ICD-10-CM

## 2021-09-10 DIAGNOSIS — F419 Anxiety disorder, unspecified: Secondary | ICD-10-CM

## 2021-09-10 DIAGNOSIS — F909 Attention-deficit hyperactivity disorder, unspecified type: Secondary | ICD-10-CM | POA: Diagnosis not present

## 2021-09-10 MED ORDER — AMPHETAMINE-DEXTROAMPHET ER 20 MG PO CP24: 20.0000 mg | ORAL_CAPSULE | ORAL | 0 refills | Status: DC

## 2021-09-10 MED ORDER — AMPHETAMINE-DEXTROAMPHET ER 20 MG PO CP24: 20.0000 mg | ORAL_CAPSULE | Freq: Every day | ORAL | 0 refills | Status: DC

## 2021-09-10 NOTE — Assessment & Plan Note (Signed)
Anxiety is generally well controlled.  He has had some mild depression related to a relationship.  He does not want any additional treatment for that.  He will continue his buspirone. ?

## 2021-09-10 NOTE — Patient Instructions (Signed)
Nice to see you. ?Please try the Xyzal, Claritin, or Allegra for your allergies.  Please use your Flonase. ?Please let me know if you have any issues with the Adderall. ?
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
## 2021-09-10 NOTE — Assessment & Plan Note (Signed)
Generally well controlled.  Discussed the option of switching the Adderall to something different to see if he tolerates it a little better.  He opted to stick with Adderall XR 20 mg daily.  This was sent to his pharmacy.  I discussed that stimulants do not have any suppressive effect on hangovers.  I discussed that he should limit alcohol intake to no more than 3 beverages at a time given anything more than that is binge drinking and there are risks with that. ?

## 2021-09-10 NOTE — Assessment & Plan Note (Signed)
He will trial either Claritin, Allegra, or Xyzal.  He will start using his Flonase. ?

## 2021-09-30 ENCOUNTER — Encounter: Payer: Self-pay | Admitting: Family Medicine

## 2021-10-05 DIAGNOSIS — M7042 Prepatellar bursitis, left knee: Secondary | ICD-10-CM | POA: Diagnosis not present

## 2021-10-05 DIAGNOSIS — M7041 Prepatellar bursitis, right knee: Secondary | ICD-10-CM | POA: Diagnosis not present

## 2021-10-13 ENCOUNTER — Other Ambulatory Visit: Payer: Self-pay | Admitting: Family Medicine

## 2021-10-13 DIAGNOSIS — F909 Attention-deficit hyperactivity disorder, unspecified type: Secondary | ICD-10-CM

## 2021-10-13 NOTE — Telephone Encounter (Signed)
Can you refill this? Pt should have refills but I imagine the pharmacy is just out due to back order. I did call patient & asked if he would call [facility] to verify & see what they may suggest. He will call back.  ?

## 2021-10-14 ENCOUNTER — Encounter: Payer: Self-pay | Admitting: Family Medicine

## 2021-10-14 NOTE — Telephone Encounter (Signed)
Sent to the pharmacy

## 2021-10-15 NOTE — Telephone Encounter (Signed)
Pt need a refill on adderall sent to [facility] in [place]. Pt stated that the one sent in is for May not April. Pt sent a mychart message also regarding this ?

## 2021-10-15 NOTE — Telephone Encounter (Signed)
Can you call the pharmacy and see if the medication is there? If it is please let the patient know.  ?

## 2021-10-29 ENCOUNTER — Ambulatory Visit
Admission: EM | Admit: 2021-10-29 | Discharge: 2021-10-29 | Disposition: A | Discharge status: Home / Self Care | Payer: BC Managed Care – PPO | Attending: Family Medicine | Admitting: Family Medicine

## 2021-10-29 ENCOUNTER — Encounter: Payer: Self-pay | Admitting: Emergency Medicine

## 2021-10-29 DIAGNOSIS — J683 Other acute and subacute respiratory conditions due to chemicals, gases, fumes and vapors: Secondary | ICD-10-CM

## 2021-10-29 DIAGNOSIS — J302 Other seasonal allergic rhinitis: Secondary | ICD-10-CM | POA: Diagnosis not present

## 2021-10-29 DIAGNOSIS — J060 Acute laryngopharyngitis: Secondary | ICD-10-CM | POA: Diagnosis not present

## 2021-10-29 LAB — POCT RAPID STREP A (OFFICE): Rapid Strep A Screen: NEGATIVE

## 2021-10-29 MED ORDER — PREDNISONE 20 MG PO TABS: 20.0000 mg | ORAL_TABLET | Freq: Every day | ORAL | 0 refills | Status: AC

## 2021-10-29 MED ORDER — ALBUTEROL SULFATE HFA 108 (90 BASE) MCG/ACT IN AERS: 2.0000 | INHALATION_SPRAY | Freq: Four times a day (QID) | RESPIRATORY_TRACT | 0 refills | Status: AC | PRN

## 2021-10-29 NOTE — Discharge Instructions (Signed)
Your rapid strep is negative. I am culturing your throat swab to rule out any active infection. ?In the meantime I am put you on a low-dose of a steroid to help with your cough which I suspect is related to allergies causing reactive airway symptoms this can also lead to wheezing and chest congestion.  If any of your symptoms worsen or do not readily improve return for evaluation. ?

## 2021-10-29 NOTE — ED Provider Notes (Signed)
?[facility] ? ? ? ?CSN: [id number] ?Arrival date & time: 10/29/21  1113 ? ? ?  ? ?History   ?Chief Complaint ?Chief Complaint  ?Patient presents with  ? Chest Congestion  ? ? ?HPI ?Cory Harding is a 21 y.o. male.  ? ?HPI ?Patient with a history of allergy induced asthma presents today with sore throat, chest congestion, cough,  ? ?Past Medical History:  ?Diagnosis Date  ? ADHD (attention deficit hyperactivity disorder)   ? Asthma   ? GERD (gastroesophageal reflux disease)   ? ? ?Patient Active Problem List  ? Diagnosis Date Noted  ? Thrush 07/23/2021  ? Patellofemoral pain syndrome of both knees 01/22/2021  ? Rectal pain 08/20/2019  ? History of asthma 05/14/2019  ? Exposure to COVID-19 virus 02/09/2019  ? Low back pain 02/09/2019  ? GERD (gastroesophageal reflux disease) 04/30/2018  ? Allergic rhinitis 04/30/2018  ? Electronic cigarette use 04/04/2018  ? Anxiety 01/16/2018  ? Epididymal cyst 08/10/2016  ? Obesity 06/01/2016  ? Attention deficit hyperactivity disorder (ADHD) 02/29/2016  ? Multiple benign nevi 01/22/2016  ? ? ?History reviewed. No pertinent surgical history. ? ? ? ? ?Home Medications   ? ?Prior to Admission medications   ?Medication Sig Start Date End Date Taking? Authorizing Provider  ?predniSONE (DELTASONE) 20 MG tablet Take 1 tablet (20 mg total) by mouth daily with breakfast for 5 days. 10/29/21 11/03/21 Yes Scot Jun, FNP  ?albuterol (VENTOLIN HFA) 108 (90 Base) MCG/ACT inhaler Inhale 2 puffs into the lungs every 6 (six) hours as needed for wheezing or shortness of breath. 10/29/21   Scot Jun, FNP  ?amphetamine-dextroamphetamine (ADDERALL XR) 20 MG 24 hr capsule Take 1 capsule (20 mg total) by mouth daily. 11/10/21   Leone Haven, MD  ?amphetamine-dextroamphetamine (ADDERALL XR) 20 MG 24 hr capsule Take 1 capsule (20 mg total) by mouth every morning. 09/10/21   Leone Haven, MD  ?amphetamine-dextroamphetamine (ADDERALL XR) 20 MG 24 hr capsule TAKE 1 CAPSULE(20  MG) BY MOUTH EVERY MORNING 10/14/21   Leone Haven, MD  ?busPIRone (BUSPAR) 10 MG tablet TAKE 1 TABLET TWICE A DAY 11/10/20   Leone Haven, MD  ?cetirizine (ZYRTEC) 10 MG tablet Take 10 mg by mouth daily.    [provider]  ?fluticasone (FLONASE) 50 MCG/ACT nasal spray Place 2 sprays into both nostrils daily. 04/04/18   Jodelle Green, FNP  ?omeprazole (PRILOSEC) 20 MG capsule TAKE 1 CAPSULE DAILY AT LEAST 30 MINUTES BEFORE BREAKFAST 04/26/21   Leone Haven, MD  ? ? ?Family History ?Family History  ?Problem Relation Age of Onset  ? Hypertension Maternal Grandfather   ? Heart disease Maternal Grandfather   ? Heart disease Paternal Grandfather   ? Sudden death Paternal Grandfather   ? Mental illness Paternal Grandfather   ? Alcoholism Paternal Grandfather   ? ? ?Social History ?Social History  ? ?Tobacco Use  ? Smoking status: Never  ? Smokeless tobacco: Never  ?Vaping Use  ? Vaping Use: Never used  ?Substance Use Topics  ? Alcohol use: No  ?  Alcohol/week: 0.0 standard drinks  ? Drug use: No  ? ? ? ?Allergies   ?Patient has no known allergies. ? ? ?Review of Systems ?Review of Systems ?Pertinent negatives listed in HPI  ? ?Physical Exam ?Triage Vital Signs ?ED Triage Vitals  ?Enc Vitals Group  ?   BP 10/29/21 1208 112/72  ?   Pulse Rate 10/29/21 1208 81  ?  Resp 10/29/21 1208 18  ?   Temp 10/29/21 1208 98.4 ?F (36.9 ?C)  ?   Temp Source 10/29/21 1208 Oral  ?   SpO2 10/29/21 1208 97 %  ?   Weight --   ?   Height --   ?   Head Circumference --   ?   Peak Flow --   ?   Pain Score 10/29/21 1221 0  ?   Pain Loc --   ?   Pain Edu? --   ?   Excl. in [place]? --   ? ?No data found. ? ?Updated Vital Signs ?BP 112/72 (BP Location: Left Arm)   Pulse 81   Temp 98.4 ?F (36.9 ?C) (Oral)   Resp 18   SpO2 97%  ? ?Visual Acuity ?Right Eye Distance:   ?Left Eye Distance:   ?Bilateral Distance:   ? ?Right Eye Near:   ?Left Eye Near:    ?Bilateral Near:    ? ?Physical Exam ?Constitutional:   ?   Appearance: He  is ill-appearing.  ?HENT:  ?   Head: Normocephalic and atraumatic.  ?   Nose: Congestion present.  ?   Mouth/Throat:  ?   Pharynx: Pharyngeal swelling, posterior oropharyngeal erythema and uvula swelling present.  ?   Tonsils: No tonsillar exudate.  ?Eyes:  ?   General: Lids are normal.  ?   Extraocular Movements: Extraocular movements intact.  ?Cardiovascular:  ?   Rate and Rhythm: Normal rate and regular rhythm.  ?Pulmonary:  ?   Effort: Pulmonary effort is normal.  ?   Breath sounds: Decreased air movement present. Rhonchi present.  ?Neurological:  ?   Mental Status: He is alert.  ?Psychiatric:     ?   Attention and Perception: Attention normal.     ?   Mood and Affect: Mood normal.     ?   Speech: Speech normal.  ? ?UC Treatments / Results  ?Labs ?(all labs ordered are listed, but only abnormal results are displayed) ?Labs Reviewed  ?POCT RAPID STREP A (OFFICE)  ? ? ?EKG ? ? ?Radiology ?No results found. ? ?Procedures ?Procedures (including critical care time) ? ?Medications Ordered in UC ?Medications - No data to display ? ?Initial Impression / Assessment and Plan / UC Course  ?I have reviewed the triage vital signs and the nursing notes. ? ?Pertinent labs & imaging results that were available during my care of the patient were reviewed by me and considered in my medical decision making (see chart for details). ? ?  ?Reactive airway exacerbation and seasonal allergies  ?With sore throat and laryngitis  ?Rapid strep is negative. ?Throat Culture pending. ?Refilled Albuterol inhaler. ?Final Clinical Impressions(s) / UC Diagnoses  ? ?Final diagnoses:  ?Seasonal allergies  ?Reactive airways dysfunction syndrome, unspecified asthma severity, with acute exacerbation ([place])  ?Sore throat and laryngitis  ? ? ? ?Discharge Instructions   ? ?  ?Your rapid strep is negative. I am culturing your throat swab to rule out any active infection. ?In the meantime I am put you on a low-dose of a steroid to help with your cough which  I suspect is related to allergies causing reactive airway symptoms this can also lead to wheezing and chest congestion.  If any of your symptoms worsen or do not readily improve return for evaluation. ? ? ?ED Prescriptions   ? ? Medication Sig Dispense Auth. Provider  ? albuterol (VENTOLIN HFA) 108 (90 Base) MCG/ACT inhaler Inhale 2 puffs into  the lungs every 6 (six) hours as needed for wheezing or shortness of breath. 18 g Scot Jun, FNP  ? predniSONE (DELTASONE) 20 MG tablet Take 1 tablet (20 mg total) by mouth daily with breakfast for 5 days. 5 tablet Scot Jun, FNP  ? ?  ? ?PDMP not reviewed this encounter. ?  ?Scot Jun, FNP ?10/29/21 1331 ? ?

## 2021-10-29 NOTE — ED Triage Notes (Signed)
Pt here with "swollen lymph nodes" in throat and chest congestion x 3 days.  ?

## 2021-11-01 LAB — CULTURE, GROUP A STREP (THRC)

## 2021-11-05 ENCOUNTER — Other Ambulatory Visit: Payer: Self-pay | Admitting: Family Medicine

## 2021-11-16 ENCOUNTER — Encounter: Payer: Self-pay | Admitting: Family Medicine

## 2021-12-14 ENCOUNTER — Ambulatory Visit: Payer: BC Managed Care – PPO | Admitting: Family Medicine

## 2021-12-14 VITALS — BP 110/70 | HR 68 | Temp 97.9°F | Ht 69.0 in | Wt 200.6 lb

## 2021-12-14 DIAGNOSIS — F909 Attention-deficit hyperactivity disorder, unspecified type: Secondary | ICD-10-CM | POA: Diagnosis not present

## 2021-12-14 DIAGNOSIS — K219 Gastro-esophageal reflux disease without esophagitis: Secondary | ICD-10-CM

## 2021-12-14 DIAGNOSIS — L237 Allergic contact dermatitis due to plants, except food: Secondary | ICD-10-CM | POA: Diagnosis not present

## 2021-12-14 MED ORDER — AMPHETAMINE-DEXTROAMPHET ER 25 MG PO CP24: 25.0000 mg | ORAL_CAPSULE | Freq: Every day | ORAL | 0 refills | Status: DC

## 2021-12-14 MED ORDER — PREDNISONE 10 MG PO TABS: ORAL_TABLET | ORAL | 0 refills | Status: AC

## 2021-12-14 NOTE — Assessment & Plan Note (Signed)
I will treat with a prednisone taper given that there is some involvement of his face and neck.  If not improving he will let us know.  I did discuss the risk of increased appetite, sleep difficulty, and agitation with this medication.  Advised to discontinue the medication if he is unable to sleep and has excessive agitation.

## 2021-12-14 NOTE — Patient Instructions (Signed)
Nice to see you. We will increase your Adderall to 25 mg daily.  If you notice any palpitations, appetite suppression, or sleep issues with this please let us know. I am treating your poison ivy with prednisone.  If you have excessive agitation or are unable to sleep while taking this you can discontinue the prednisone and let us know.

## 2021-12-14 NOTE — Assessment & Plan Note (Signed)
Chronic issue.  Stable.  He will continue omeprazole 20 mg daily.  I discussed long-term risk of this medication including kidney dysfunction, bone weakening, poor absorption of certain vitamins and minerals.  Discussed that the benefit of the medication could outweigh the risk for him.

## 2021-12-14 NOTE — Assessment & Plan Note (Signed)
Suboptimally controlled.  We will increase his Adderall to 25 mg daily.  He will monitor for palpitations, appetite suppression, and sleep changes.  He will follow-up in 1 month.

## 2021-12-14 NOTE — Progress Notes (Signed)
[name], MD Phone: [phone]  Cory Harding is a 21 y.o. male who presents today for f/u.  ADHD Medication: adderall XR 20 mg daily Effectiveness: not quite as much focus as he would like Palpitations: no Sleep difficulty: no Appetite suppression: no  GERD:   Reflux symptoms: no   Abd pain: no   Blood in stool: no  Dysphagia: no   Medication: taking omeprazole  Poison ivy: Patient notes he got into poison ivy 2 days ago.  He mostly has rash on his bilateral forearms though has some on his neck and has a couple of spots that came up today on his face.  He notes itching with this.  He notes no genital involvement.    Social History   Tobacco Use  Smoking Status Never  Smokeless Tobacco Never    Current Outpatient Medications on File Prior to Visit  Medication Sig Dispense Refill   albuterol (VENTOLIN HFA) 108 (90 Base) MCG/ACT inhaler Inhale 2 puffs into the lungs every 6 (six) hours as needed for wheezing or shortness of breath. 18 g 0   busPIRone (BUSPAR) 10 MG tablet TAKE 1 TABLET TWICE A DAY 180 tablet 3   cetirizine (ZYRTEC) 10 MG tablet Take 10 mg by mouth daily.     fluticasone (FLONASE) 50 MCG/ACT nasal spray Place 2 sprays into both nostrils daily. 16 g 6   omeprazole (PRILOSEC) 20 MG capsule TAKE 1 CAPSULE DAILY AT LEAST 30 MINUTES BEFORE BREAKFAST 90 capsule 3   No current facility-administered medications on file prior to visit.     ROS see history of present illness  Objective  Physical Exam Vitals:   12/14/21 0949  BP: 110/70  Pulse: 68  Temp: 97.9 F (36.6 C)  SpO2: 99%    BP Readings from Last 3 Encounters:  12/14/21 110/70  10/29/21 112/72  09/10/21 120/80   Wt Readings from Last 3 Encounters:  12/14/21 200 lb 9.6 oz (91 kg)  09/10/21 230 lb (104.3 kg)  06/09/21 221 lb 9.6 oz (100.5 kg)    Physical Exam Constitutional:      General: He is not in acute distress.    Appearance: He is not diaphoretic.  Cardiovascular:      Rate and Rhythm: Normal rate and regular rhythm.     Heart sounds: Normal heart sounds.  Pulmonary:     Effort: Pulmonary effort is normal.     Breath sounds: Normal breath sounds.  Skin:    General: Skin is warm and dry.     Comments: Scattered erythematous excoriated scabbed areas on his bilateral arms, he has a few areas on his left anterior neck and left shin that are similar  Neurological:     Mental Status: He is alert.      Assessment/Plan: Please see individual problem list.  Problem List Items Addressed This Visit     Attention deficit hyperactivity disorder (ADHD) - Primary (Chronic)    Suboptimally controlled.  We will increase his Adderall to 25 mg daily.  He will monitor for palpitations, appetite suppression, and sleep changes.  He will follow-up in 1 month.      Relevant Medications   amphetamine-dextroamphetamine (ADDERALL XR) 25 MG 24 hr capsule   GERD (gastroesophageal reflux disease) (Chronic)    Chronic issue.  Stable.  He will continue omeprazole 20 mg daily.  I discussed long-term risk of this medication including kidney dysfunction, bone weakening, poor absorption of certain vitamins and minerals.  Discussed that the benefit  of the medication could outweigh the risk for him.      Poison ivy    I will treat with a prednisone taper given that there is some involvement of his face and neck.  If not improving he will let us know.  I did discuss the risk of increased appetite, sleep difficulty, and agitation with this medication.  Advised to discontinue the medication if he is unable to sleep and has excessive agitation.      Relevant Medications   predniSONE (DELTASONE) 10 MG tablet      Return in about 1 month (around 01/13/2022) for ADHD.   [name], MD [place]

## 2021-12-17 ENCOUNTER — Ambulatory Visit: Payer: BC Managed Care – PPO | Admitting: Family Medicine

## 2022-01-12 ENCOUNTER — Encounter: Payer: Self-pay | Admitting: Family Medicine

## 2022-01-12 ENCOUNTER — Other Ambulatory Visit: Payer: Self-pay | Admitting: Family Medicine

## 2022-01-12 DIAGNOSIS — F909 Attention-deficit hyperactivity disorder, unspecified type: Secondary | ICD-10-CM

## 2022-01-12 MED ORDER — AMPHETAMINE-DEXTROAMPHET ER 25 MG PO CP24: 25.0000 mg | ORAL_CAPSULE | Freq: Every day | ORAL | 0 refills | Status: DC

## 2022-01-18 ENCOUNTER — Encounter: Payer: Self-pay | Admitting: Family Medicine

## 2022-01-18 ENCOUNTER — Telehealth (INDEPENDENT_AMBULATORY_CARE_PROVIDER_SITE_OTHER): Payer: BC Managed Care – PPO | Admitting: Family Medicine

## 2022-01-18 DIAGNOSIS — F909 Attention-deficit hyperactivity disorder, unspecified type: Secondary | ICD-10-CM

## 2022-01-18 DIAGNOSIS — F419 Anxiety disorder, unspecified: Secondary | ICD-10-CM

## 2022-01-18 MED ORDER — AMPHETAMINE-DEXTROAMPHET ER 30 MG PO CP24: 30.0000 mg | ORAL_CAPSULE | Freq: Every day | ORAL | 0 refills | Status: DC

## 2022-01-18 NOTE — Assessment & Plan Note (Addendum)
Seems to still be inadequately controlled.  I discussed the option of increasing the dose versus monitoring.  We jointly opted to increase the dose of his Adderall to 30 mg once daily.  He will monitor for any appetite suppression, sleep changes, or palpitations.  He will follow-up in 1 month in the office so that we can get a blood pressure and pulse rate on him.  I did discuss that there is the potential that his insurance may not approve this given that he just picked up a new prescription a few days ago.

## 2022-01-18 NOTE — Assessment & Plan Note (Signed)
Well-controlled.  He will continue buspirone 10 mg twice daily.

## 2022-01-18 NOTE — Progress Notes (Signed)
Virtual Visit via video Note  This visit type was conducted due to national recommendations for restrictions regarding the COVID-19 pandemic (e.g. social distancing).  This format is felt to be most appropriate for this patient at this time.  All issues noted in this document were discussed and addressed.  No physical exam was performed (except for noted visual exam findings with Video Visits).   I connected with Cory Harding today at  8:00 AM EDT by a video enabled telemedicine application or telephone and verified that I am speaking with the correct person using two identifiers. Location patient: car Location provider: work  Persons participating in the virtual visit: patient, provider  I discussed the limitations, risks, security and privacy concerns of performing an evaluation and management service by telephone and the availability of in person appointments. I also discussed with the patient that there may be a patient responsible charge related to this service. The patient expressed understanding and agreed to proceed.  Reason for visit: f/u.  HPI: ADHD Medication: adderall XR 25 mg daily Effectiveness: minimal benefit over the 20 mg dose he was on previously, he is not sure if it is related to the medication not being effective or if it is related to him being over worked Palpitations: no Sleep difficulty: no Appetite suppression: no  Anxiety: patient notes minimal anxiety. Notes no depression. Is taking buspar.   ROS: See pertinent positives and negatives per HPI.  Past Medical History:  Diagnosis Date   ADHD (attention deficit hyperactivity disorder)    Asthma    GERD (gastroesophageal reflux disease)     No past surgical history on file.  Family History  Problem Relation Age of Onset   Hypertension Maternal Grandfather    Heart disease Maternal Grandfather    Heart disease Paternal Grandfather    Sudden death Paternal Grandfather    Mental illness Paternal  Grandfather    Alcoholism Paternal Grandfather     SOCIAL HX: nonsmoker   Current Outpatient Medications:    albuterol (VENTOLIN HFA) 108 (90 Base) MCG/ACT inhaler, Inhale 2 puffs into the lungs every 6 (six) hours as needed for wheezing or shortness of breath., Disp: 18 g, Rfl: 0   busPIRone (BUSPAR) 10 MG tablet, TAKE 1 TABLET TWICE A DAY, Disp: 180 tablet, Rfl: 3   cetirizine (ZYRTEC) 10 MG tablet, Take 10 mg by mouth daily., Disp: , Rfl:    fluticasone (FLONASE) 50 MCG/ACT nasal spray, Place 2 sprays into both nostrils daily., Disp: 16 g, Rfl: 6   omeprazole (PRILOSEC) 20 MG capsule, TAKE 1 CAPSULE DAILY AT LEAST 30 MINUTES BEFORE BREAKFAST, Disp: 90 capsule, Rfl: 3   amphetamine-dextroamphetamine (ADDERALL XR) 30 MG 24 hr capsule, Take 1 capsule (30 mg total) by mouth daily., Disp: 30 capsule, Rfl: 0  EXAM:  VITALS per patient if applicable:  GENERAL: alert, oriented, appears well and in no acute distress  HEENT: atraumatic, conjunttiva clear, no obvious abnormalities on inspection of external nose and ears  NECK: normal movements of the head and neck  LUNGS: on inspection no signs of respiratory distress, breathing rate appears normal, no obvious gross SOB, gasping or wheezing  CV: no obvious cyanosis  MS: moves all visible extremities without noticeable abnormality  PSYCH/NEURO: pleasant and cooperative, no obvious depression or anxiety, speech and thought processing grossly intact  ASSESSMENT AND PLAN:  Discussed the following assessment and plan:  Problem List Items Addressed This Visit     Anxiety (Chronic)    Well-controlled.  He will continue buspirone 10 mg twice daily.      Attention deficit hyperactivity disorder (ADHD) (Chronic)    Seems to still be inadequately controlled.  I discussed the option of increasing the dose versus monitoring.  We jointly opted to increase the dose of his Adderall to 30 mg once daily.  He will monitor for any appetite  suppression, sleep changes, or palpitations.  He will follow-up in 1 month in the office so that we can get a blood pressure and pulse rate on him.  I did discuss that there is the potential that his insurance may not approve this given that he just picked up a new prescription a few days ago.      Relevant Medications   amphetamine-dextroamphetamine (ADDERALL XR) 30 MG 24 hr capsule    Return in about 1 month (around 02/18/2022) for ADHD in office.   I discussed the assessment and treatment plan with the patient. The patient was provided an opportunity to ask questions and all were answered. The patient agreed with the plan and demonstrated an understanding of the instructions.   The patient was advised to call back or seek an in-person evaluation if the symptoms worsen or if the condition fails to improve as anticipated.   [name], MD

## 2022-02-20 ENCOUNTER — Encounter: Payer: Self-pay | Admitting: Family Medicine

## 2022-02-21 ENCOUNTER — Other Ambulatory Visit: Payer: Self-pay | Admitting: Family

## 2022-02-21 DIAGNOSIS — F909 Attention-deficit hyperactivity disorder, unspecified type: Secondary | ICD-10-CM

## 2022-02-21 MED ORDER — AMPHETAMINE-DEXTROAMPHET ER 30 MG PO CP24: 30.0000 mg | ORAL_CAPSULE | Freq: Every day | ORAL | 0 refills | Status: DC

## 2022-03-01 ENCOUNTER — Ambulatory Visit: Payer: BC Managed Care – PPO | Admitting: Family Medicine

## 2022-03-01 ENCOUNTER — Encounter: Payer: Self-pay | Admitting: Family Medicine

## 2022-03-01 VITALS — BP 118/80 | HR 67 | Temp 98.1°F | Ht 69.0 in | Wt 175.8 lb

## 2022-03-01 DIAGNOSIS — Z23 Encounter for immunization: Secondary | ICD-10-CM | POA: Diagnosis not present

## 2022-03-01 DIAGNOSIS — E663 Overweight: Secondary | ICD-10-CM | POA: Diagnosis not present

## 2022-03-01 DIAGNOSIS — F419 Anxiety disorder, unspecified: Secondary | ICD-10-CM | POA: Diagnosis not present

## 2022-03-01 DIAGNOSIS — F909 Attention-deficit hyperactivity disorder, unspecified type: Secondary | ICD-10-CM

## 2022-03-01 MED ORDER — AMPHETAMINE-DEXTROAMPHET ER 25 MG PO CP24: 25.0000 mg | ORAL_CAPSULE | ORAL | 0 refills | Status: DC

## 2022-03-01 NOTE — Assessment & Plan Note (Signed)
Discussed he needs to add in some food in between his protein shake and his dinner.  Discussed having a lean meat and vegetable and a single portion of a carbohydrate.  Discussed the portion size of scrappable carbohydrates is half a cup.  I encouraged him to remain active.

## 2022-03-01 NOTE — Progress Notes (Signed)
Cory Rumps, MD Phone: [phone]  Cory Harding is a 21 y.o. male who presents today for f/u.  ADHD Medication: adderall 30 mg daily Effectiveness: yes, though feels that he has had lack of motivation later in the day since increasing the dose and that the 25 mg dose may have worked better Palpitations: no Sleep difficulty: no Appetite suppression: no  Anxiety: Patient has no anxiety or depression.  He continues on BuSpar.  Overweight: Patient's weight has trended down significantly with a low-carb diet and intermittent fasting.  He is down about 53 pounds.  He is getting lots of activity though no specific exercise.  He does a 14-hour fast.  He has a protein shake when he breaks the fast and then he has a meat and vegetable and small amount of carbohydrates at dinner.  He does not typically have anything in between the protein shake in his dinner.   Social History   Tobacco Use  Smoking Status Never  Smokeless Tobacco Never    Current Outpatient Medications on File Prior to Visit  Medication Sig Dispense Refill   albuterol (VENTOLIN HFA) 108 (90 Base) MCG/ACT inhaler Inhale 2 puffs into the lungs every 6 (six) hours as needed for wheezing or shortness of breath. 18 g 0   busPIRone (BUSPAR) 10 MG tablet TAKE 1 TABLET TWICE A DAY 180 tablet 3   cetirizine (ZYRTEC) 10 MG tablet Take 10 mg by mouth daily.     fluticasone (FLONASE) 50 MCG/ACT nasal spray Place 2 sprays into both nostrils daily. 16 g 6   omeprazole (PRILOSEC) 20 MG capsule TAKE 1 CAPSULE DAILY AT LEAST 30 MINUTES BEFORE BREAKFAST 90 capsule 3   No current facility-administered medications on file prior to visit.     ROS see history of present illness  Objective  Physical Exam Vitals:   03/01/22 0820  BP: 118/80  Pulse: 67  Temp: 98.1 F (36.7 C)  SpO2: 99%    BP Readings from Last 3 Encounters:  03/01/22 118/80  12/14/21 110/70  10/29/21 112/72   Wt Readings from Last 3 Encounters:   03/01/22 175 lb 12.8 oz (79.7 kg)  01/18/22 200 lb (90.7 kg)  12/14/21 200 lb 9.6 oz (91 kg)    Physical Exam Constitutional:      General: He is not in acute distress.    Appearance: He is not diaphoretic.  Cardiovascular:     Rate and Rhythm: Normal rate and regular rhythm.     Heart sounds: Normal heart sounds.  Pulmonary:     Effort: Pulmonary effort is normal.     Breath sounds: Normal breath sounds.  Skin:    General: Skin is warm and dry.  Neurological:     Mental Status: He is alert.      Assessment/Plan: Please see individual problem list.  Problem List Items Addressed This Visit     Anxiety (Chronic)    Well-controlled.  He will continue buspirone 10 mg twice daily.      Attention deficit hyperactivity disorder (ADHD) - Primary (Chronic)    We will go back to Adderall XR 25 mg daily.  We will see if reducing the dose helps with his motivation.  I do suspect that his inadequate dietary intake is contributing to that as well.      Relevant Medications   amphetamine-dextroamphetamine (ADDERALL XR) 25 MG 24 hr capsule (Start on 05/01/2022)   amphetamine-dextroamphetamine (ADDERALL XR) 25 MG 24 hr capsule (Start on 04/01/2022)   amphetamine-dextroamphetamine (  ADDERALL XR) 25 MG 24 hr capsule   Overweight (BMI 25.0-29.9) (Chronic)    Discussed he needs to add in some food in between his protein shake and his dinner.  Discussed having a lean meat and vegetable and a single portion of a carbohydrate.  Discussed the portion size of scrappable carbohydrates is half a cup.  I encouraged him to remain active.      Other Visit Diagnoses     Need for immunization against influenza       Relevant Orders   Flu Vaccine QUAD 53moIM (Fluarix, Fluzone & Alfiuria Quad PF) (Completed)       Return in about 3 months (around 06/01/2022) for ADHD.   ETommi Rumps MD [facility]

## 2022-03-01 NOTE — Assessment & Plan Note (Signed)
Well-controlled.  He will continue buspirone 10 mg twice daily.

## 2022-03-01 NOTE — Assessment & Plan Note (Signed)
We will go back to Adderall XR 25 mg daily.  We will see if reducing the dose helps with his motivation.  I do suspect that his inadequate dietary intake is contributing to that as well.

## 2022-03-01 NOTE — Patient Instructions (Signed)
Nice to see you. Please try adding a lean protein, vegetable or fruit, and a single portion of carbohydrates in between your protein shake and at your dinner. We will reduce your dose of Adderall and see if that helps with your motivation as well.

## 2022-04-20 ENCOUNTER — Other Ambulatory Visit: Payer: Self-pay | Admitting: Family Medicine

## 2022-04-20 DIAGNOSIS — K219 Gastro-esophageal reflux disease without esophagitis: Secondary | ICD-10-CM

## 2022-06-01 ENCOUNTER — Encounter: Payer: Self-pay | Admitting: Family Medicine

## 2022-06-01 ENCOUNTER — Ambulatory Visit: Payer: BC Managed Care – PPO | Admitting: Family Medicine

## 2022-06-01 VITALS — BP 124/78 | HR 74 | Temp 98.5°F | Ht 69.0 in | Wt 171.2 lb

## 2022-06-01 DIAGNOSIS — Z13220 Encounter for screening for lipoid disorders: Secondary | ICD-10-CM

## 2022-06-01 DIAGNOSIS — F909 Attention-deficit hyperactivity disorder, unspecified type: Secondary | ICD-10-CM | POA: Diagnosis not present

## 2022-06-01 DIAGNOSIS — E663 Overweight: Secondary | ICD-10-CM

## 2022-06-01 LAB — LIPID PANEL
Cholesterol: 131 mg/dL (ref 0–200)
HDL: 45.7 mg/dL (ref >39.00)
LDL Cholesterol: 72 mg/dL (ref 0–99)
NonHDL: 85.66
Total CHOL/HDL Ratio: 3
Triglycerides: 67 mg/dL (ref 0.0–149.0)
VLDL: 13.4 mg/dL (ref 0.0–40.0)

## 2022-06-01 LAB — COMPREHENSIVE METABOLIC PANEL
ALT: 19 U/L (ref 0–53)
AST: 18 U/L (ref 0–37)
Albumin: 4.5 g/dL (ref 3.5–5.2)
Alkaline Phosphatase: 76 U/L (ref 39–117)
BUN: 12 mg/dL (ref 6–23)
CO2: 32 mEq/L (ref 19–32)
Calcium: 9.7 mg/dL (ref 8.4–10.5)
Chloride: 101 mEq/L (ref 96–112)
Creatinine, Ser: 0.83 mg/dL (ref 0.40–1.50)
GFR: 123.52 mL/min (ref >60.00)
Glucose, Bld: 89 mg/dL (ref 70–99)
Potassium: 3.9 mEq/L (ref 3.5–5.1)
Sodium: 139 mEq/L (ref 135–145)
Total Bilirubin: 1.1 mg/dL (ref 0.2–1.2)
Total Protein: 7.4 g/dL (ref 6.0–8.3)

## 2022-06-01 LAB — HEMOGLOBIN A1C: Hgb A1c MFr Bld: 5 % (ref 4.6–6.5)

## 2022-06-01 MED ORDER — AMPHETAMINE-DEXTROAMPHET ER 25 MG PO CP24: 25.0000 mg | ORAL_CAPSULE | ORAL | 0 refills | Status: DC

## 2022-06-01 NOTE — Progress Notes (Signed)
[name], MD Phone: [phone]  Cory Harding is a 21 y.o. male who presents today for f/u.  ADHD Medication: adderall XR Effectiveness: yes Palpitations: no Sleep difficulty: no Appetite suppression: no  Overweight: Patient continues to work on exercise and dietary changes.  He is no longer fasting.  He does have low carbohydrate breakfast with some protein.  He has a protein shake and then for dinner he will have a steak.  He does not really like vegetables.  He does eat some fruits most days.  He goes to the gym typically 5 days a week though over the last 2 weeks has not been able to go.   Social History   Tobacco Use  Smoking Status Never  Smokeless Tobacco Never    Current Outpatient Medications on File Prior to Visit  Medication Sig Dispense Refill   albuterol (VENTOLIN HFA) 108 (90 Base) MCG/ACT inhaler Inhale 2 puffs into the lungs every 6 (six) hours as needed for wheezing or shortness of breath. 18 g 0   busPIRone (BUSPAR) 10 MG tablet TAKE 1 TABLET TWICE A DAY 180 tablet 3   cetirizine (ZYRTEC) 10 MG tablet Take 10 mg by mouth daily.     fluticasone (FLONASE) 50 MCG/ACT nasal spray Place 2 sprays into both nostrils daily. 16 g 6   omeprazole (PRILOSEC) 20 MG capsule TAKE 1 CAPSULE DAILY AT LEAST 30 MINUTES BEFORE BREAKFAST 90 capsule 3   No current facility-administered medications on file prior to visit.     ROS see history of present illness  Objective  Physical Exam Vitals:   06/01/22 0819  BP: 124/78  Pulse: 74  Temp: 98.5 F (36.9 C)  SpO2: 99%    BP Readings from Last 3 Encounters:  06/01/22 124/78  03/01/22 118/80  12/14/21 110/70   Wt Readings from Last 3 Encounters:  06/01/22 171 lb 3.2 oz (77.7 kg)  03/01/22 175 lb 12.8 oz (79.7 kg)  01/18/22 200 lb (90.7 kg)    Physical Exam Constitutional:      General: He is not in acute distress.    Appearance: He is not diaphoretic.  Cardiovascular:     Rate and Rhythm: Normal rate  and regular rhythm.     Heart sounds: Normal heart sounds.  Pulmonary:     Effort: Pulmonary effort is normal.     Breath sounds: Normal breath sounds.  Skin:    General: Skin is warm and dry.  Neurological:     Mental Status: He is alert.      Assessment/Plan: Please see individual problem list.  Problem List Items Addressed This Visit     Attention deficit hyperactivity disorder (ADHD) - Primary (Chronic)    Adequately controlled.  He will continue Adderall XR 25 mg daily.  Controlled substance database reviewed.  Refills given.      Relevant Medications   amphetamine-dextroamphetamine (ADDERALL XR) 25 MG 24 hr capsule   amphetamine-dextroamphetamine (ADDERALL XR) 25 MG 24 hr capsule (Start on 07/01/2022)   amphetamine-dextroamphetamine (ADDERALL XR) 25 MG 24 hr capsule (Start on 08/01/2022)   Overweight (BMI 25.0-29.9) (Chronic)    Encouraged healthy diet.  Discussed reduction of red meat.  Discussed increasing vegetable intake.  Discussed he could sneak a handful of spinach into a smoothie to get some easy vegetable intake.  I encouraged him not to exercise with too heavy of weights given risk of injury.      Relevant Orders   HgB A1c   Other Visit Diagnoses  Lipid screening       Relevant Orders   Lipid panel   Comp Met (CMET)        Return in about 3 months (around 09/01/2022) for adhd.   [name], MD [place]

## 2022-06-01 NOTE — Assessment & Plan Note (Signed)
Adequately controlled.  He will continue Adderall XR 25 mg daily.  Controlled substance database reviewed.  Refills given.

## 2022-06-01 NOTE — Assessment & Plan Note (Signed)
Encouraged healthy diet.  Discussed reduction of red meat.  Discussed increasing vegetable intake.  Discussed he could sneak a handful of spinach into a smoothie to get some easy vegetable intake.  I encouraged him not to exercise with too heavy of weights given risk of injury.

## 2022-06-22 ENCOUNTER — Encounter: Payer: Self-pay | Admitting: Family Medicine

## 2022-08-28 ENCOUNTER — Encounter: Payer: Self-pay | Admitting: Family Medicine

## 2022-08-29 ENCOUNTER — Other Ambulatory Visit: Payer: Self-pay | Admitting: Family

## 2022-08-29 DIAGNOSIS — F909 Attention-deficit hyperactivity disorder, unspecified type: Secondary | ICD-10-CM

## 2022-08-29 MED ORDER — AMPHETAMINE-DEXTROAMPHET ER 25 MG PO CP24: 25.0000 mg | ORAL_CAPSULE | ORAL | 0 refills | Status: DC

## 2022-08-30 ENCOUNTER — Telehealth: Payer: Self-pay | Admitting: Family Medicine

## 2022-08-30 DIAGNOSIS — F909 Attention-deficit hyperactivity disorder, unspecified type: Secondary | ICD-10-CM

## 2022-08-31 ENCOUNTER — Ambulatory Visit: Payer: BC Managed Care – PPO | Admitting: Family Medicine

## 2022-08-31 MED ORDER — AMPHETAMINE-DEXTROAMPHET ER 25 MG PO CP24: 25.0000 mg | ORAL_CAPSULE | ORAL | 0 refills | Status: DC

## 2022-08-31 NOTE — Telephone Encounter (Signed)
Patient called about his amphetamine-dextroamphetamine (ADDERALL XR) 25 MG 24 hr. Patient made an appointment to see provider on 09/13/2022 for future refills.

## 2022-09-13 ENCOUNTER — Encounter: Payer: Self-pay | Admitting: Family Medicine

## 2022-09-13 ENCOUNTER — Ambulatory Visit: Payer: BC Managed Care – PPO | Admitting: Family Medicine

## 2022-09-13 VITALS — BP 116/72 | HR 83 | Temp 98.1°F | Ht 69.0 in | Wt 165.8 lb

## 2022-09-13 DIAGNOSIS — B070 Plantar wart: Secondary | ICD-10-CM | POA: Diagnosis not present

## 2022-09-13 DIAGNOSIS — F909 Attention-deficit hyperactivity disorder, unspecified type: Secondary | ICD-10-CM | POA: Diagnosis not present

## 2022-09-13 DIAGNOSIS — R21 Rash and other nonspecific skin eruption: Secondary | ICD-10-CM

## 2022-09-13 DIAGNOSIS — F419 Anxiety disorder, unspecified: Secondary | ICD-10-CM

## 2022-09-13 DIAGNOSIS — R229 Localized swelling, mass and lump, unspecified: Secondary | ICD-10-CM

## 2022-09-13 MED ORDER — TRIAMCINOLONE ACETONIDE 0.1 % EX CREA: 1.0000 | TOPICAL_CREAM | Freq: Two times a day (BID) | CUTANEOUS | 0 refills | Status: DC

## 2022-09-13 MED ORDER — AMPHETAMINE-DEXTROAMPHET ER 25 MG PO CP24: 25.0000 mg | ORAL_CAPSULE | ORAL | 0 refills | Status: DC

## 2022-09-13 NOTE — Assessment & Plan Note (Signed)
Chronic issue.  Adequately controlled.  He does note some stress though he notes this is typical.  He will continue buspirone 10 mg twice daily.

## 2022-09-13 NOTE — Assessment & Plan Note (Signed)
Patient will use salicylic acid pads over-the-counter.  Discussed it may take weeks or months for this to become effective.  If not effective he can see dermatology for this.

## 2022-09-13 NOTE — Assessment & Plan Note (Signed)
Possible cyst at the tibial tuberosity of the left knee.  Will refer to dermatology for evaluation.

## 2022-09-13 NOTE — Assessment & Plan Note (Signed)
Chronic issue.  Possible an atopic dermatitis.  I will provide the patient with topical triamcinolone 0.1% and refer to dermatology.

## 2022-09-13 NOTE — Patient Instructions (Signed)
Nice to see you. Will try triamcinolone for the rash. Dermatology should contact you as well. Please try over-the-counter salicylic acid pads for your plantar wart.

## 2022-09-13 NOTE — Assessment & Plan Note (Signed)
Chronic issue.  Adequately controlled.  He will continue Adderall XR 25 mg daily.  Controlled substance database reviewed.  Refills provided.

## 2022-09-13 NOTE — Progress Notes (Signed)
[name], MD Phone: [phone]  Cory Harding is a 22 y.o. male who presents today for f/u.  ADHD Medication: adderall XR 25 mg daily Effectiveness: yes Palpitations: no Sleep difficulty: no Appetite suppression: no  Left knee nodule: Patient notes there is a nodule just adjacent to the tibial tuberosity that has been bothering him for 2 months.  Notes it does not hurt unless he puts pressure on his knee in that area.  Left foot callus: Patient notes there is some discomfort on the lateral portion of the plantar surface of his left foot.  Only occurs when he is standing.  This has been going for a long time.  He has tried to remove the callus.  Rash: Patient notes rash at the ASIS bilaterally that comes and goes.  This has been going on for a year or so.  It hurts when it flares up.  There is some scarring from the rash.  There is no itching.  Stress: Patient notes no significant depression or anxiety though does note stress related to life and work.   Social History   Tobacco Use  Smoking Status Never  Smokeless Tobacco Never    Current Outpatient Medications on File Prior to Visit  Medication Sig Dispense Refill   albuterol (VENTOLIN HFA) 108 (90 Base) MCG/ACT inhaler Inhale 2 puffs into the lungs every 6 (six) hours as needed for wheezing or shortness of breath. 18 g 0   busPIRone (BUSPAR) 10 MG tablet TAKE 1 TABLET TWICE A DAY 180 tablet 3   cetirizine (ZYRTEC) 10 MG tablet Take 10 mg by mouth daily.     fluticasone (FLONASE) 50 MCG/ACT nasal spray Place 2 sprays into both nostrils daily. 16 g 6   omeprazole (PRILOSEC) 20 MG capsule TAKE 1 CAPSULE DAILY AT LEAST 30 MINUTES BEFORE BREAKFAST 90 capsule 3   No current facility-administered medications on file prior to visit.     ROS see history of present illness  Objective  Physical Exam Vitals:   09/13/22 0810  BP: 116/72  Pulse: 83  Temp: 98.1 F (36.7 C)  SpO2: 99%    BP Readings from Last 3  Encounters:  09/13/22 116/72  06/01/22 124/78  03/01/22 118/80   Wt Readings from Last 3 Encounters:  09/13/22 165 lb 12.8 oz (75.2 kg)  06/01/22 171 lb 3.2 oz (77.7 kg)  03/01/22 175 lb 12.8 oz (79.7 kg)    Physical Exam Constitutional:      General: He is not in acute distress.    Appearance: He is not diaphoretic.  Cardiovascular:     Rate and Rhythm: Normal rate and regular rhythm.     Heart sounds: Normal heart sounds.  Pulmonary:     Effort: Pulmonary effort is normal.     Breath sounds: Normal breath sounds.  Musculoskeletal:       Legs:       Feet:  Skin:    General: Skin is warm and dry.  Neurological:     Mental Status: He is alert.     Right side ASIS area  Assessment/Plan: Please see individual problem list.  Attention deficit hyperactivity disorder (ADHD), unspecified ADHD type Assessment & Plan: Chronic issue.  Adequately controlled.  He will continue Adderall XR 25 mg daily.  Controlled substance database reviewed.  Refills provided.  Orders: -     Amphetamine-Dextroamphet ER; Take 1 capsule by mouth every morning.  Dispense: 30 capsule; Refill: 0 -     Amphetamine-Dextroamphet ER; Take  1 capsule by mouth every morning.  Dispense: 30 capsule; Refill: 0 -     Amphetamine-Dextroamphet ER; Take 1 capsule by mouth every morning.  Dispense: 30 capsule; Refill: 0  Rash Assessment & Plan: Chronic issue.  Possible an atopic dermatitis.  I will provide the patient with topical triamcinolone 0.1% and refer to dermatology.  Orders: -     Triamcinolone Acetonide; Apply 1 Application topically 2 (two) times daily.  Dispense: 30 g; Refill: 0 -     Ambulatory referral to Dermatology  Subcutaneous nodule Assessment & Plan: Possible cyst at the tibial tuberosity of the left knee.  Will refer to dermatology for evaluation.  Orders: -     Ambulatory referral to Dermatology  Plantar wart of left foot Assessment & Plan: Patient will use salicylic acid pads  over-the-counter.  Discussed it may take weeks or months for this to become effective.  If not effective he can see dermatology for this.   Anxiety Assessment & Plan: Chronic issue.  Adequately controlled.  He does note some stress though he notes this is typical.  He will continue buspirone 10 mg twice daily.     Return in about 3 months (around 12/14/2022).   [name], MD [place]

## 2022-10-31 ENCOUNTER — Encounter: Payer: Self-pay | Admitting: Family Medicine

## 2022-10-31 ENCOUNTER — Telehealth: Payer: Self-pay | Admitting: Family Medicine

## 2022-10-31 ENCOUNTER — Other Ambulatory Visit: Payer: Self-pay | Admitting: Family Medicine

## 2022-10-31 NOTE — Telephone Encounter (Signed)
Called and advised patient Rx of Adderall #30 day supply was sent to [facility] in [place] on 10/29/22. He will contact pharmacy for pickup.

## 2022-10-31 NOTE — Telephone Encounter (Signed)
Pt called back staying that the pharmacy wont give it to him until Friday 5/3. As per pt, he's going away Wednesday and he can't wait for Friday for his meds. Theres anything that the dr. Birdie Sons can do so he can get it before.

## 2022-10-31 NOTE — Telephone Encounter (Signed)
Called and spoke with Pharmacy, they are authorizing the early fill to be ready tomorrow.  Called and notified patient.

## 2022-10-31 NOTE — Telephone Encounter (Signed)
Please see the message I sent to the patient. It is likely related to his insurance. Can you please call the pharmacy and see if there is a way for them to release the prescription 2 days early for the patient.

## 2022-10-31 NOTE — Telephone Encounter (Signed)
Prescription Request  10/31/2022  LOV: 09/13/2022  What is the name of the medication or equipment? amphetamine-dextroamphetamine (ADDERALL XR) 25 MG 24 hr capsule Patient is leaving on Wednesday to go to [place] and will run out of medication on Saturday. Wanted to get medication refilled before he leaves on Wednesday morning.   Have you contacted your pharmacy to request a refill? No   Which pharmacy would you like this sent to?   [id number] [place], [place] - [address] AT [facility] OF [address] [place] [address] Phone: [phone] Fax: [phone]   Patient notified that their request is being sent to the clinical staff for review and that they should receive a response within 2 business days.   Please advise at Mobile [phone] (mobile)

## 2022-11-22 ENCOUNTER — Telehealth: Payer: BC Managed Care – PPO | Admitting: Nurse Practitioner

## 2022-11-22 DIAGNOSIS — J020 Streptococcal pharyngitis: Secondary | ICD-10-CM | POA: Diagnosis not present

## 2022-11-22 MED ORDER — AMOXICILLIN 500 MG PO CAPS: 500.0000 mg | ORAL_CAPSULE | Freq: Two times a day (BID) | ORAL | 0 refills | Status: AC

## 2022-11-22 NOTE — Progress Notes (Signed)
E-Visit for Sore Throat - Strep Symptoms  We are sorry that you are not feeling well.  Here is how we plan to help!  Based on what you have shared with me it is likely that you have strep pharyngitis.  Strep pharyngitis is inflammation and infection in the back of the throat.  This is an infection cause by bacteria and is treated with antibiotics.  I have prescribed Amoxicillin 500 mg twice a day for 10 days. For throat pain, we recommend over the counter oral pain relief medications such as acetaminophen or aspirin, or anti-inflammatory medications such as ibuprofen or naproxen sodium. Topical treatments such as oral throat lozenges or sprays may be used as needed. Strep infections are not as easily transmitted as other respiratory infections, however we still recommend that you avoid close contact with loved ones, especially the very young and elderly.  Remember to wash your hands thoroughly throughout the day as this is the number one way to prevent the spread of infection and wipe down door knobs and counters with disinfectant.   Home Care: Only take medications as instructed by your medical team. Complete the entire course of an antibiotic. Do not take these medications with alcohol. A steam or ultrasonic humidifier can help congestion.  You can place a towel over your head and breathe in the steam from hot water coming from a faucet. Avoid close contacts especially the very young and the elderly. Cover your mouth when you cough or sneeze. Always remember to wash your hands.  Get Help Right Away If: You develop worsening fever or sinus pain. You develop a severe head ache or visual changes. Your symptoms persist after you have completed your treatment plan.  Make sure you Understand these instructions. Will watch your condition. Will get help right away if you are not doing well or get worse.   Thank you for choosing an e-visit.  Your e-visit answers were reviewed by a board  certified advanced clinical practitioner to complete your personal care plan. Depending upon the condition, your plan could have included both over the counter or prescription medications.  Please review your pharmacy choice. Make sure the pharmacy is open so you can pick up prescription now. If there is a problem, you may contact your provider through MyChart messaging and have the prescription routed to another pharmacy.  Your safety is important to us. If you have drug allergies check your prescription carefully.   For the next 24 hours you can use MyChart to ask questions about today's visit, request a non-urgent call back, or ask for a work or school excuse. You will get an email in the next two days asking about your experience. I hope that your e-visit has been valuable and will speed your recovery.   Meds ordered this encounter  Medications   amoxicillin (AMOXIL) 500 MG capsule    Sig: Take 1 capsule (500 mg total) by mouth 2 (two) times daily for 10 days.    Dispense:  20 capsule    Refill:  0     I spent approximately 5 minutes reviewing the patient's history, current symptoms and coordinating their care today.   

## 2022-11-25 NOTE — Telephone Encounter (Signed)
He already has a refill that is available and based on when he filled his most recent prescription the refill that is available should get him to the appointment on 6/21.

## 2022-11-25 NOTE — Telephone Encounter (Signed)
Pt called in stating that he appt just moved from 6/12 to the 6/21 due to the provider not been in office. And he was wondering if he can have some amphetamine-dextroamphetamine (ADDERALL XR) 25 MG 24 hr capsule sent over to his pharmacy until he sees provider??

## 2022-11-29 NOTE — Telephone Encounter (Signed)
Spoke to Patient to let him know that the refill that is available should get him through 12/23/22 when he sees Dr. Birdie Sons

## 2022-12-14 ENCOUNTER — Ambulatory Visit: Payer: BC Managed Care – PPO | Admitting: Family Medicine

## 2022-12-20 DIAGNOSIS — F411 Generalized anxiety disorder: Secondary | ICD-10-CM | POA: Diagnosis not present

## 2022-12-23 ENCOUNTER — Encounter: Payer: Self-pay | Admitting: Family Medicine

## 2022-12-23 ENCOUNTER — Telehealth: Payer: BC Managed Care – PPO | Admitting: Family Medicine

## 2022-12-23 DIAGNOSIS — F909 Attention-deficit hyperactivity disorder, unspecified type: Secondary | ICD-10-CM

## 2022-12-23 MED ORDER — AMPHETAMINE-DEXTROAMPHET ER 25 MG PO CP24: 25.0000 mg | ORAL_CAPSULE | ORAL | 0 refills | Status: DC

## 2022-12-23 NOTE — Assessment & Plan Note (Signed)
Chronic issue.  Adequately controlled.  Patient will continue Adderall XR 25 mg daily.  Controlled substance database reviewed.  Refill sent to pharmacy.

## 2022-12-23 NOTE — Progress Notes (Signed)
Virtual Visit via video Note  This visit type was conducted due to national recommendations for restrictions regarding the COVID-19 pandemic (e.g. social distancing).  This format is felt to be most appropriate for this patient at this time.  All issues noted in this document were discussed and addressed.  No physical exam was performed (except for noted visual exam findings with Video Visits).   I connected with Cory Harding today at  4:30 PM EDT by a video enabled telemedicine application and verified that I am speaking with the correct person using two identifiers. Location patient: home Location provider: work Persons participating in the virtual visit: patient, provider  I discussed the limitations, risks, security and privacy concerns of performing an evaluation and management service by telephone and the availability of in person appointments. I also discussed with the patient that there may be a patient responsible charge related to this service. The patient expressed understanding and agreed to proceed.  Reason for visit: f/u.  HPI: ADHD Medication: adderall XR 25 mg daily Effectiveness: yes Palpitations: no Sleep difficulty: no Appetite suppression: no    ROS: See pertinent positives and negatives per HPI.  Past Medical History:  Diagnosis Date   ADHD (attention deficit hyperactivity disorder)    Asthma    GERD (gastroesophageal reflux disease)     History reviewed. No pertinent surgical history.  Family History  Problem Relation Age of Onset   Hypertension Maternal Grandfather    Heart disease Maternal Grandfather    Heart disease Paternal Grandfather    Sudden death Paternal Grandfather    Mental illness Paternal Grandfather    Alcoholism Paternal Grandfather     SOCIAL HX: nonsmoker   Current Outpatient Medications:    albuterol (VENTOLIN HFA) 108 (90 Base) MCG/ACT inhaler, Inhale 2 puffs into the lungs every 6 (six) hours as needed for wheezing or  shortness of breath., Disp: 18 g, Rfl: 0   busPIRone (BUSPAR) 10 MG tablet, TAKE 1 TABLET TWICE A DAY, Disp: 180 tablet, Rfl: 3   cetirizine (ZYRTEC) 10 MG tablet, Take 10 mg by mouth daily., Disp: , Rfl:    fluticasone (FLONASE) 50 MCG/ACT nasal spray, Place 2 sprays into both nostrils daily., Disp: 16 g, Rfl: 6   omeprazole (PRILOSEC) 20 MG capsule, TAKE 1 CAPSULE DAILY AT LEAST 30 MINUTES BEFORE BREAKFAST, Disp: 90 capsule, Rfl: 3   triamcinolone cream (KENALOG) 0.1 %, Apply 1 Application topically 2 (two) times daily., Disp: 30 g, Rfl: 0   [START ON 01/01/2023] amphetamine-dextroamphetamine (ADDERALL XR) 25 MG 24 hr capsule, Take 1 capsule by mouth every morning., Disp: 30 capsule, Rfl: 0   [START ON 01/31/2023] amphetamine-dextroamphetamine (ADDERALL XR) 25 MG 24 hr capsule, Take 1 capsule by mouth every morning., Disp: 30 capsule, Rfl: 0   [START ON 03/03/2023] amphetamine-dextroamphetamine (ADDERALL XR) 25 MG 24 hr capsule, Take 1 capsule by mouth every morning., Disp: 30 capsule, Rfl: 0  EXAM:  VITALS per patient if applicable:  GENERAL: alert, oriented, appears well and in no acute distress  HEENT: atraumatic, conjunttiva clear, no obvious abnormalities on inspection of external nose and ears  NECK: normal movements of the head and neck  LUNGS: on inspection no signs of respiratory distress, breathing rate appears normal, no obvious gross SOB, gasping or wheezing  CV: no obvious cyanosis  MS: moves all visible extremities without noticeable abnormality  PSYCH/NEURO: pleasant and cooperative, no obvious depression or anxiety, speech and thought processing grossly intact  ASSESSMENT AND PLAN:  Discussed  the following assessment and plan:  Problem List Items Addressed This Visit     Attention deficit hyperactivity disorder (ADHD) - Primary (Chronic)    Chronic issue.  Adequately controlled.  Patient will continue Adderall XR 25 mg daily.  Controlled substance database reviewed.   Refill sent to pharmacy.      Relevant Medications   amphetamine-dextroamphetamine (ADDERALL XR) 25 MG 24 hr capsule (Start on 01/01/2023)   amphetamine-dextroamphetamine (ADDERALL XR) 25 MG 24 hr capsule (Start on 01/31/2023)   amphetamine-dextroamphetamine (ADDERALL XR) 25 MG 24 hr capsule (Start on 03/03/2023)    Return in about 3 months (around 03/25/2023).   I discussed the assessment and treatment plan with the patient. The patient was provided an opportunity to ask questions and all were answered. The patient agreed with the plan and demonstrated an understanding of the instructions.   The patient was advised to call back or seek an in-person evaluation if the symptoms worsen or if the condition fails to improve as anticipated.  Marikay Alar, MD

## 2022-12-28 ENCOUNTER — Telehealth: Payer: Self-pay | Admitting: Family Medicine

## 2022-12-28 NOTE — Telephone Encounter (Signed)
Lvm for pt scheduled his 3 months f/u after his MyChart visit on 6/21.

## 2023-01-02 DIAGNOSIS — F411 Generalized anxiety disorder: Secondary | ICD-10-CM | POA: Diagnosis not present

## 2023-01-04 ENCOUNTER — Encounter: Payer: Self-pay | Admitting: *Deleted

## 2023-01-04 NOTE — Telephone Encounter (Signed)
Sent mychart for pt to call & scheduled a fu in September with Dr. Birdie Sons.

## 2023-02-16 ENCOUNTER — Encounter (INDEPENDENT_AMBULATORY_CARE_PROVIDER_SITE_OTHER): Payer: Self-pay

## 2023-03-27 ENCOUNTER — Ambulatory Visit: Payer: BC Managed Care – PPO | Admitting: Family Medicine

## 2023-03-27 ENCOUNTER — Encounter: Payer: Self-pay | Admitting: Family Medicine

## 2023-03-27 VITALS — BP 122/74 | HR 73 | Temp 98.3°F | Ht 69.0 in | Wt 161.4 lb

## 2023-03-27 DIAGNOSIS — Z23 Encounter for immunization: Secondary | ICD-10-CM

## 2023-03-27 DIAGNOSIS — F419 Anxiety disorder, unspecified: Secondary | ICD-10-CM

## 2023-03-27 DIAGNOSIS — F909 Attention-deficit hyperactivity disorder, unspecified type: Secondary | ICD-10-CM

## 2023-03-27 DIAGNOSIS — K219 Gastro-esophageal reflux disease without esophagitis: Secondary | ICD-10-CM

## 2023-03-27 MED ORDER — AMPHETAMINE-DEXTROAMPHET ER 25 MG PO CP24: 25.0000 mg | ORAL_CAPSULE | ORAL | 0 refills | Status: DC

## 2023-03-27 NOTE — Assessment & Plan Note (Signed)
Chronic issue.  Very well-controlled.  He will change omeprazole to 20 mg every other day for the next month and if no reflux symptoms he can discontinue at that time.  If he has recurrence of reflux he will let us know.  Discussed risks of long-term PPI use.

## 2023-03-27 NOTE — Progress Notes (Signed)
Marikay Alar, MD Phone: [phone]  Cory Harding is a 22 y.o. male who presents today for f/u.  ADHD Medication: adderall Effectiveness: yes Palpitations: no Sleep difficulty: no Appetite suppression: no  GERD:   Reflux symptoms: no   Abd pain: no   Blood in stool: no  Dysphagia: no   Medication: omeprazole Patient has lost a significant amount of weight with diet and exercise changes.  Anxiety: Adequately controlled.  Patient is on BuSpar.  No depression or SI.   Social History   Tobacco Use  Smoking Status Never  Smokeless Tobacco Never    Current Outpatient Medications on File Prior to Visit  Medication Sig Dispense Refill   albuterol (VENTOLIN HFA) 108 (90 Base) MCG/ACT inhaler Inhale 2 puffs into the lungs every 6 (six) hours as needed for wheezing or shortness of breath. 18 g 0   busPIRone (BUSPAR) 10 MG tablet TAKE 1 TABLET TWICE A DAY 180 tablet 3   cetirizine (ZYRTEC) 10 MG tablet Take 10 mg by mouth daily.     fluticasone (FLONASE) 50 MCG/ACT nasal spray Place 2 sprays into both nostrils daily. 16 g 6   omeprazole (PRILOSEC) 20 MG capsule TAKE 1 CAPSULE DAILY AT LEAST 30 MINUTES BEFORE BREAKFAST 90 capsule 3   triamcinolone cream (KENALOG) 0.1 % Apply 1 Application topically 2 (two) times daily. 30 g 0   No current facility-administered medications on file prior to visit.     ROS see history of present illness  Objective  Physical Exam Vitals:   03/27/23 0819  BP: 122/74  Pulse: 73  Temp: 98.3 F (36.8 C)  SpO2: 99%    BP Readings from Last 3 Encounters:  03/27/23 122/74  09/13/22 116/72  06/01/22 124/78   Wt Readings from Last 3 Encounters:  03/27/23 161 lb 6.4 oz (73.2 kg)  09/13/22 165 lb 12.8 oz (75.2 kg)  06/01/22 171 lb 3.2 oz (77.7 kg)    Physical Exam Constitutional:      General: He is not in acute distress.    Appearance: He is not diaphoretic.  Cardiovascular:     Rate and Rhythm: Normal rate and regular rhythm.      Heart sounds: Normal heart sounds.  Pulmonary:     Effort: Pulmonary effort is normal.     Breath sounds: Normal breath sounds.  Neurological:     Mental Status: He is alert.      Assessment/Plan: Please see individual problem list.  Gastroesophageal reflux disease, unspecified whether esophagitis present Assessment & Plan: Chronic issue.  Very well-controlled.  He will change omeprazole to 20 mg every other day for the next month and if no reflux symptoms he can discontinue at that time.  If he has recurrence of reflux he will let us know.  Discussed risks of long-term PPI use.   Attention deficit hyperactivity disorder (ADHD), unspecified ADHD type Assessment & Plan: Chronic issue.  Adequately controlled.  Patient will continue Adderall XR 25 mg daily.  Controlled subs database reviewed.  Refill sent to pharmacy.  Orders: -     Amphetamine-Dextroamphet ER; Take 1 capsule by mouth every morning.  Dispense: 30 capsule; Refill: 0 -     Amphetamine-Dextroamphet ER; Take 1 capsule by mouth every morning.  Dispense: 30 capsule; Refill: 0 -     Amphetamine-Dextroamphet ER; Take 1 capsule by mouth every morning.  Dispense: 30 capsule; Refill: 0  Anxiety Assessment & Plan: Chronic issue.  Adequately controlled.  Patient will continue BuSpar 10 mg  twice daily.   Encounter for administration of vaccine -     Flu vaccine trivalent PF, 6mos and older(Flulaval,Afluria,Fluarix,Fluzone)    Return in about 3 months (around 06/26/2023).   Marikay Alar, MD [facility]

## 2023-03-27 NOTE — Assessment & Plan Note (Signed)
Chronic issue.  Adequately controlled.  Patient will continue BuSpar 10 mg twice daily.

## 2023-03-27 NOTE — Assessment & Plan Note (Signed)
Chronic issue.  Adequately controlled.  Patient will continue Adderall XR 25 mg daily.  Controlled subs database reviewed.  Refill sent to pharmacy.

## 2023-04-14 DIAGNOSIS — F411 Generalized anxiety disorder: Secondary | ICD-10-CM | POA: Diagnosis not present

## 2023-04-17 ENCOUNTER — Other Ambulatory Visit: Payer: Self-pay | Admitting: Family Medicine

## 2023-04-17 DIAGNOSIS — K219 Gastro-esophageal reflux disease without esophagitis: Secondary | ICD-10-CM

## 2023-04-28 DIAGNOSIS — F411 Generalized anxiety disorder: Secondary | ICD-10-CM | POA: Diagnosis not present

## 2023-05-16 DIAGNOSIS — F411 Generalized anxiety disorder: Secondary | ICD-10-CM | POA: Diagnosis not present

## 2023-06-22 ENCOUNTER — Other Ambulatory Visit: Payer: Self-pay | Admitting: Family Medicine

## 2023-06-22 ENCOUNTER — Encounter: Payer: Self-pay | Admitting: Family Medicine

## 2023-06-22 ENCOUNTER — Telehealth: Payer: Self-pay

## 2023-06-22 DIAGNOSIS — F909 Attention-deficit hyperactivity disorder, unspecified type: Secondary | ICD-10-CM

## 2023-06-22 NOTE — Telephone Encounter (Signed)
Copied from CRM [id number]. Topic: Clinical - Prescription Issue >> Jun 22, 2023  2:02 PM Efraim Kaufmann C wrote: Reason for CRM: patient had an appointment on 12/23 for checkup and refill of Adderall, however, appointment was cancelled by provider. Patient called to reschedule appointment to 07/11/2023 and was wondering if he could get a small prescription of Adderall in the interim as he only has about a weeks worth of pills left. Patient asked that provider call and advise if he is able to provide this.

## 2023-06-23 MED ORDER — AMPHETAMINE-DEXTROAMPHET ER 25 MG PO CP24: 25.0000 mg | ORAL_CAPSULE | ORAL | 0 refills | Status: DC

## 2023-06-23 NOTE — Telephone Encounter (Signed)
Patient is aware of medication refill 

## 2023-06-23 NOTE — Addendum Note (Signed)
Addended by: Glori Luis on: 06/23/2023 11:28 AM   Modules accepted: Orders

## 2023-06-23 NOTE — Telephone Encounter (Signed)
Noted.  I have sent in a refill for him to fill closer to the time that it is due.

## 2023-06-26 ENCOUNTER — Ambulatory Visit: Payer: BC Managed Care – PPO | Admitting: Family Medicine

## 2023-07-11 ENCOUNTER — Ambulatory Visit: Payer: BC Managed Care – PPO | Admitting: Family Medicine

## 2023-07-11 ENCOUNTER — Ambulatory Visit
Admission: EM | Admit: 2023-07-11 | Discharge: 2023-07-11 | Disposition: A | Discharge status: Home / Self Care | Payer: BC Managed Care – PPO | Attending: Emergency Medicine | Admitting: Emergency Medicine

## 2023-07-11 VITALS — BP 110/60 | HR 68 | Temp 97.8°F | Ht 69.0 in | Wt 165.2 lb

## 2023-07-11 DIAGNOSIS — H1032 Unspecified acute conjunctivitis, left eye: Secondary | ICD-10-CM

## 2023-07-11 DIAGNOSIS — F909 Attention-deficit hyperactivity disorder, unspecified type: Secondary | ICD-10-CM | POA: Diagnosis not present

## 2023-07-11 DIAGNOSIS — H5712 Ocular pain, left eye: Secondary | ICD-10-CM | POA: Diagnosis not present

## 2023-07-11 DIAGNOSIS — R195 Other fecal abnormalities: Secondary | ICD-10-CM

## 2023-07-11 MED ORDER — AMPHETAMINE-DEXTROAMPHET ER 30 MG PO CP24: 30.0000 mg | ORAL_CAPSULE | ORAL | 0 refills | Status: DC

## 2023-07-11 MED ORDER — POLYMYXIN B-TRIMETHOPRIM 10000-0.1 UNIT/ML-% OP SOLN: 1.0000 [drp] | Freq: Four times a day (QID) | OPHTHALMIC | 0 refills | Status: AC

## 2023-07-11 NOTE — ED Provider Notes (Signed)
 Cory Harding    CSN: [id number] Arrival date & time: 07/11/23  1906      History   Chief Complaint Chief Complaint  Patient presents with   Eye Problem    HPI Cory Harding is a 23 y.o. male.  Patient presents with left eye pain, redness, tearing, blurred vision after he accidentally had a plastic clip hit him in the eye while he was working on a car this afternoon.  He denies other changes in his vision, purulent drainage, fever.  Treatment attempted at home with eyewash.  The history is provided by the patient and medical records.    Past Medical History:  Diagnosis Date   ADHD (attention deficit hyperactivity disorder)    Asthma    GERD (gastroesophageal reflux disease)     Patient Active Problem List   Diagnosis Date Noted   Loose stools 07/11/2023   Rash 09/13/2022   Subcutaneous nodule 09/13/2022   Plantar wart of left foot 09/13/2022   Thrush 07/23/2021   Patellofemoral pain syndrome of both knees 01/22/2021   Rectal pain 08/20/2019   History of asthma 05/14/2019   Exposure to COVID-19 virus 02/09/2019   Low back pain 02/09/2019   GERD (gastroesophageal reflux disease) 04/30/2018   Allergic rhinitis 04/30/2018   Electronic cigarette use 04/04/2018   Anxiety 01/16/2018   Poison ivy 10/17/2016   Epididymal cyst 08/10/2016   Overweight (BMI 25.0-29.9) 06/01/2016   Attention deficit hyperactivity disorder (ADHD) 02/29/2016   Multiple benign nevi 01/22/2016    History reviewed. No pertinent surgical history.     Home Medications    Prior to Admission medications   Medication Sig Start Date End Date Taking? Authorizing Provider  trimethoprim -polymyxin b  (POLYTRIM ) ophthalmic solution Place 1 drop into the left eye 4 (four) times daily for 7 days. 07/11/23 07/18/23 Yes Corlis Burnard DEL, NP  albuterol  (VENTOLIN  HFA) 108 (90 Base) MCG/ACT inhaler Inhale 2 puffs into the lungs every 6 (six) hours as needed for wheezing or shortness of breath. 10/29/21    Arloa Suzen RAMAN, NP  amphetamine -dextroamphetamine  (ADDERALL XR) 30 MG 24 hr capsule Take 1 capsule (30 mg total) by mouth every morning. 07/11/23   Maribeth Camellia MATSU, MD  busPIRone  (BUSPAR ) 10 MG tablet TAKE 1 TABLET TWICE A DAY 10/31/22   Maribeth Camellia MATSU, MD  cetirizine (ZYRTEC) 10 MG tablet Take 10 mg by mouth daily.    [provider]  fluticasone  (FLONASE ) 50 MCG/ACT nasal spray Place 2 sprays into both nostrils daily. 04/04/18   Osker Tinnie HERO, FNP  omeprazole  (PRILOSEC) 20 MG capsule TAKE 1 CAPSULE DAILY AT LEAST 30 MINUTES BEFORE BREAKFAST 04/17/23   Maribeth Camellia MATSU, MD  triamcinolone  cream (KENALOG ) 0.1 % Apply 1 Application topically 2 (two) times daily. 09/13/22   Maribeth Camellia MATSU, MD    Family History Family History  Problem Relation Age of Onset   Hypertension Maternal Grandfather    Heart disease Maternal Grandfather    Heart disease Paternal Grandfather    Sudden death Paternal Grandfather    Mental illness Paternal Grandfather    Alcoholism Paternal Grandfather     Social History Social History   Tobacco Use   Smoking status: Never   Smokeless tobacco: Never  Vaping Use   Vaping status: Never Used  Substance Use Topics   Alcohol use: No    Alcohol/week: 0.0 standard drinks of alcohol   Drug use: No     Allergies   Patient has no known allergies.  Review of Systems Review of Systems  Constitutional:  Negative for chills and fever.  Eyes:  Positive for pain, discharge, redness and visual disturbance.     Physical Exam Triage Vital Signs ED Triage Vitals [07/11/23 1923]  Encounter Vitals Group     BP 124/76     Systolic BP Percentile      Diastolic BP Percentile      Pulse Rate 75     Resp 18     Temp 97.7 F (36.5 C)     Temp src      SpO2 98 %     Weight      Height      Head Circumference      Peak Flow      Pain Score 5     Pain Loc      Pain Education      Exclude from Growth Chart    No data found.  Updated  Vital Signs BP 124/76   Pulse 75   Temp 97.7 F (36.5 C)   Resp 18   SpO2 98%   Visual Acuity Right Eye Distance: 20/30 Left Eye Distance: 20/40 Bilateral Distance: 20/25  Right Eye Near:   Left Eye Near:    Bilateral Near:     Physical Exam Constitutional:      General: He is not in acute distress. HENT:     Mouth/Throat:     Mouth: Mucous membranes are moist.  Eyes:     General: Lids are normal. Vision grossly intact.        Left eye: No foreign body.     Extraocular Movements: Extraocular movements intact.     Conjunctiva/sclera:     Left eye: Left conjunctiva is injected.     Pupils: Pupils are equal, round, and reactive to light.     Left eye: No fluorescein uptake.     Comments: Left conjunctiva injected.  No fluorescein uptake or foreign body noted.  Cardiovascular:     Rate and Rhythm: Normal rate and regular rhythm.  Pulmonary:     Effort: Pulmonary effort is normal. No respiratory distress.  Neurological:     Mental Status: He is alert.      UC Treatments / Results  Labs (all labs ordered are listed, but only abnormal results are displayed) Labs Reviewed - No data to display  EKG   Radiology No results found.  Procedures Procedures (including critical care time)  Medications Ordered in UC Medications - No data to display  Initial Impression / Assessment and Plan / UC Course  I have reviewed the triage vital signs and the nursing notes.  Pertinent labs & imaging results that were available during my care of the patient were reviewed by me and considered in my medical decision making (see chart for details).    Left eye pain and conjunctivitis.  No foreign body or fluorescein uptake noted.  No purulent drainage.  Treating with Polytrim  eyedrops.  Instructed patient to follow-up with his eye care provider tomorrow.  ED precautions discussed.  He agrees to plan of care.  Final Clinical Impressions(s) / UC Diagnoses   Final diagnoses:  Left  eye pain  Acute conjunctivitis of left eye, unspecified acute conjunctivitis type     Discharge Instructions      Use the antibiotic eyedrops as prescribed.    Follow-up with your eye care provider tomorrow.    Go to the emergency department if you have acute eye pain, changes in  your vision, or other concerning symptoms.        ED Prescriptions     Medication Sig Dispense Auth. Provider   trimethoprim -polymyxin b  (POLYTRIM ) ophthalmic solution Place 1 drop into the left eye 4 (four) times daily for 7 days. 10 mL Corlis Burnard DEL, NP      PDMP not reviewed this encounter.   Corlis Burnard DEL, NP 07/11/23 1958

## 2023-07-11 NOTE — Assessment & Plan Note (Signed)
 Chronic issue.  Possibly dietary related.  Patient was given a FODMAP diet and advised to try to cut out any foods on the list that he is eating frequently.  Discussed cutting out any supplements.  Discussed replacing his protein shake with something else as those can certainly cause GI upset.  Advised to cut out artificial sugars.  If not improving with dietary changes may need to consider GI evaluation.

## 2023-07-11 NOTE — ED Triage Notes (Signed)
 Patient to Urgent Care with complaints of left sided eye pain. Reports that he was working on a vehicle and the clip popped into his left eye.  Incident occurred this afternoon. Some blurred vision.

## 2023-07-11 NOTE — Discharge Instructions (Addendum)
Use the antibiotic eyedrops as prescribed.    Follow-up with your eye care provider tomorrow.    Go to the emergency department if you have acute eye pain, changes in your vision, or other concerning symptoms.

## 2023-07-11 NOTE — Progress Notes (Signed)
 Camellia Her, MD Phone: [phone]  Cory Harding is a 23 y.o. male who presents today for f/u.  ADHD: Patient feels as though he is maybe not as focused as he was when he first started on the 25 mg dose of Adderall.  Notes issues starting about an hour after taking the Adderall.  He notes minimal appetite suppression though notes this is not any different than its ever been on any of his medicines for ADHD.  He notes no sleep issues or palpitations.  Denies depression and anxiety.  Loose stools: Patient notes these have been going on for a year or so.  Sometimes they are watery.  Sometimes he has urgency with this.  He notes 4 bowel movements a day.  No nausea or vomiting.  He has intermittently been taking a supplement called tribulus.  He also does whey protein shakes for lunch.  He eats lots of steak.  He has generally cut out dairy.  Social History   Tobacco Use  Smoking Status Never  Smokeless Tobacco Never    Current Outpatient Medications on File Prior to Visit  Medication Sig Dispense Refill   albuterol  (VENTOLIN  HFA) 108 (90 Base) MCG/ACT inhaler Inhale 2 puffs into the lungs every 6 (six) hours as needed for wheezing or shortness of breath. 18 g 0   busPIRone  (BUSPAR ) 10 MG tablet TAKE 1 TABLET TWICE A DAY 180 tablet 3   cetirizine (ZYRTEC) 10 MG tablet Take 10 mg by mouth daily.     fluticasone  (FLONASE ) 50 MCG/ACT nasal spray Place 2 sprays into both nostrils daily. 16 g 6   omeprazole  (PRILOSEC) 20 MG capsule TAKE 1 CAPSULE DAILY AT LEAST 30 MINUTES BEFORE BREAKFAST 90 capsule 3   triamcinolone  cream (KENALOG ) 0.1 % Apply 1 Application topically 2 (two) times daily. 30 g 0   No current facility-administered medications on file prior to visit.     ROS see history of present illness  Objective  Physical Exam Vitals:   07/11/23 0833  BP: 110/60  Pulse: 68  Temp: 97.8 F (36.6 C)  SpO2: 99%    BP Readings from Last 3 Encounters:  07/11/23 110/60   03/27/23 122/74  09/13/22 116/72   Wt Readings from Last 3 Encounters:  07/11/23 165 lb 3.2 oz (74.9 kg)  03/27/23 161 lb 6.4 oz (73.2 kg)  09/13/22 165 lb 12.8 oz (75.2 kg)    Physical Exam Constitutional:      General: He is not in acute distress.    Appearance: He is not diaphoretic.  Cardiovascular:     Rate and Rhythm: Normal rate and regular rhythm.     Heart sounds: Normal heart sounds.  Pulmonary:     Effort: Pulmonary effort is normal.     Breath sounds: Normal breath sounds.  Abdominal:     General: Bowel sounds are normal. There is no distension.     Palpations: Abdomen is soft.     Tenderness: There is no abdominal tenderness.  Skin:    General: Skin is warm and dry.  Neurological:     Mental Status: He is alert.      Assessment/Plan: Please see individual problem list.  Attention deficit hyperactivity disorder (ADHD), unspecified ADHD type Assessment & Plan: Chronic issue.  Suboptimally controlled.  We will trial Adderall XR 30 mg daily and see if this is beneficial.  Controlled substance database reviewed.  Patient will follow-up in a few weeks to see how this is working.  Orders: -  Amphetamine -Dextroamphet ER; Take 1 capsule (30 mg total) by mouth every morning.  Dispense: 30 capsule; Refill: 0  Loose stools Assessment & Plan: Chronic issue.  Possibly dietary related.  Patient was given a FODMAP diet and advised to try to cut out any foods on the list that he is eating frequently.  Discussed cutting out any supplements.  Discussed replacing his protein shake with something else as those can certainly cause GI upset.  Advised to cut out artificial sugars.  If not improving with dietary changes may need to consider GI evaluation.     Return in about 3 weeks (around 08/01/2023) for Recheck ADHD, 4 months transfer of care.   Camellia Her, MD [facility]

## 2023-07-11 NOTE — Assessment & Plan Note (Signed)
 Chronic issue.  Suboptimally controlled.  We will trial Adderall XR 30 mg daily and see if this is beneficial.  Controlled substance database reviewed.  Patient will follow-up in a few weeks to see how this is working.

## 2023-07-11 NOTE — Patient Instructions (Signed)
 Nice to see you. Will try Adderall 30 mg by mouth every morning and see if this is more effective.  I will see you back in 2 to 3 weeks to recheck prior to me leaving the practice.  Low-FODMAP Eating Plan  FODMAP stands for fermentable oligosaccharides, disaccharides, monosaccharides, and polyols. These are sugars that are hard for some people to digest. A low-FODMAP eating plan may help some people who have irritable bowel syndrome (IBS) and certain other bowel (intestinal) diseases to manage their symptoms. This meal plan can be complicated to follow. Work with a diet and nutrition specialist (dietitian) to make a low-FODMAP eating plan that is right for you. A dietitian can help make sure that you get enough nutrition from this diet. What are tips for following this plan? Reading food labels Check labels for hidden FODMAPs such as: High-fructose syrup. Honey. Agave. Natural fruit flavors. Onion or garlic powder. Choose low-FODMAP foods that contain 3-4 grams of fiber per serving. Check food labels for serving sizes. Eat only one serving at a time to make sure FODMAP levels stay low. Shopping Shop with a list of foods that are recommended on this diet and make a meal plan. Meal planning Follow a low-FODMAP eating plan for up to 6 weeks, or as told by your health care provider or dietitian. To follow the eating plan: Eliminate high-FODMAP foods from your diet completely. Choose only low-FODMAP foods to eat. You will do this for 2-6 weeks. Gradually reintroduce high-FODMAP foods into your diet one at a time. Most people should wait a few days before introducing the next new high-FODMAP food into their meal plan. Your dietitian can recommend how quickly you may reintroduce foods. Keep a daily record of what and how much you eat and drink. Make note of any symptoms that you have after eating. Review your daily record with a dietitian regularly to identify which foods you can eat and which  foods you should avoid. General tips Drink enough fluid each day to keep your urine pale yellow. Avoid processed foods. These often have added sugar and may be high in FODMAPs. Avoid most dairy products, whole grains, and sweeteners. Work with a dietitian to make sure you get enough fiber in your diet. Avoid high FODMAP foods at meals to manage symptoms. Recommended foods Fruits Bananas, oranges, tangerines, lemons, limes, blueberries, raspberries, strawberries, grapes, cantaloupe, honeydew melon, kiwi, papaya, passion fruit, and pineapple. Limited amounts of dried cranberries, banana chips, and shredded coconut. Vegetables Eggplant, zucchini, cucumber, peppers, green beans, bean sprouts, lettuce, arugula, kale, Swiss chard, spinach, collard greens, bok choy, summer squash, potato, and tomato. Limited amounts of corn, carrot, and sweet potato. Green parts of scallions. Grains Gluten-free grains, such as rice, oats, buckwheat, quinoa, corn, polenta, and millet. Gluten-free pasta, bread, or cereal. Rice noodles. Corn tortillas. Meats and other proteins Unseasoned beef, pork, poultry, or fish. Eggs. [place]. Tofu (firm) and tempeh. Limited amounts of nuts and seeds, such as almonds, walnuts, brazil nuts, pecans, peanuts, nut butters, pumpkin seeds, chia seeds, and sunflower seeds. Dairy Lactose-free milk, yogurt, and kefir. Lactose-free cottage cheese and ice cream. Non-dairy milks, such as almond, coconut, hemp, and rice milk. Non-dairy yogurt. Limited amounts of goat cheese, brie, mozzarella, parmesan, swiss, and other hard cheeses. Fats and oils Butter-free spreads. Vegetable oils, such as olive, canola, and sunflower oil. Seasoning and other foods Artificial sweeteners with names that do not end in ol, such as aspartame, saccharine, and stevia. Maple syrup, white table sugar, raw  sugar, brown sugar, and molasses. Mayonnaise, soy sauce, and tamari. Fresh basil, coriander, parsley, rosemary, and  thyme. Beverages Water and mineral water. Sugar-sweetened soft drinks. Small amounts of orange juice or cranberry juice. Black and green tea. Most dry wines. Coffee. The items listed above may not be a complete list of foods and beverages you can eat. Contact a dietitian for more information. Foods to avoid Fruits Fresh, dried, and juiced forms of apple, pear, watermelon, peach, plum, cherries, apricots, blackberries, boysenberries, figs, nectarines, and mango. Avocado. Vegetables Chicory root, artichoke, asparagus, cabbage, snow peas, Brussels sprouts, broccoli, sugar snap peas, mushrooms, celery, and cauliflower. Onions, garlic, leeks, and the white part of scallions. Grains Wheat, including kamut, durum, and semolina. Barley and bulgur. Couscous. Wheat-based cereals. Wheat noodles, bread, crackers, and pastries. Meats and other proteins Fried or fatty meat. Sausage. Cashews and pistachios. Soybeans, baked beans, black beans, chickpeas, kidney beans, fava beans, navy beans, lentils, black-eyed peas, and split peas. Dairy Milk, yogurt, ice cream, and soft cheese. Cream and sour cream. Milk-based sauces. Custard. Buttermilk. Soy milk. Seasoning and other foods Any sugar-free gum or candy. Foods that contain artificial sweeteners such as sorbitol, mannitol, isomalt, or xylitol. Foods that contain honey, high-fructose corn syrup, or agave. Bouillon, vegetable stock, beef stock, and chicken stock. Garlic and onion powder. Condiments made with onion, such as hummus, chutney, pickles, relish, salad dressing, and salsa. Tomato paste. Beverages Chicory-based drinks. Coffee substitutes. Chamomile tea. Fennel tea. Sweet or fortified wines such as port or sherry. Diet soft drinks made with isomalt, mannitol, maltitol, sorbitol, or xylitol. Apple, pear, and mango juice. Juices with high-fructose corn syrup. The items listed above may not be a complete list of foods and beverages you should avoid. Contact a  dietitian for more information. Summary FODMAP stands for fermentable oligosaccharides, disaccharides, monosaccharides, and polyols. These are sugars that are hard for some people to digest. A low-FODMAP eating plan is a short-term diet that helps to ease symptoms of certain bowel diseases. The eating plan usually lasts up to 6 weeks. After that, high-FODMAP foods are reintroduced gradually and one at a time. This can help you find out which foods may be causing symptoms. A low-FODMAP eating plan can be complicated. It is best to work with a dietitian who has experience with this type of plan. This information is not intended to replace advice given to you by your health care provider. Make sure you discuss any questions you have with your health care provider. Document Revised: 11/07/2019 Document Reviewed: 11/07/2019 Elsevier Patient Education  2024 [employer].

## 2023-07-13 DIAGNOSIS — H04123 Dry eye syndrome of bilateral lacrimal glands: Secondary | ICD-10-CM | POA: Diagnosis not present

## 2023-07-26 ENCOUNTER — Telehealth: Payer: BC Managed Care – PPO | Admitting: Nurse Practitioner

## 2023-07-26 DIAGNOSIS — R6889 Other general symptoms and signs: Secondary | ICD-10-CM

## 2023-07-26 MED ORDER — OSELTAMIVIR PHOSPHATE 75 MG PO CAPS: 75.0000 mg | ORAL_CAPSULE | Freq: Two times a day (BID) | ORAL | 0 refills | Status: AC

## 2023-07-26 NOTE — Progress Notes (Signed)
E visit for Flu like symptoms   We are sorry that you are not feeling well.  Here is how we plan to help! Based on what you have shared with me it looks like you may have a respiratory virus that may be influenza.  Influenza or "the flu" is   an infection caused by a respiratory virus. The flu virus is highly contagious and persons who did not receive their yearly flu vaccination may "catch" the flu from close contact.  We have anti-viral medications to treat the viruses that cause this infection. They are not a "cure" and only shorten the course of the infection. These prescriptions are most effective when they are given within the first 2 days of "flu" symptoms. Antiviral medication are indicated if you have a high risk of complications from the flu. You should  also consider an antiviral medication if you are in close contact with someone who is at risk. These medications can help patients avoid complications from the flu  but have side effects that you should know. Possible side effects from Tamiflu or oseltamivir include nausea, vomiting, diarrhea, dizziness, headaches, eye redness, sleep problems or other respiratory symptoms. You should not take Tamiflu if you have an allergy to oseltamivir or any to the ingredients in Tamiflu.  Based upon your symptoms and potential risk factors I have prescribed Oseltamivir (Tamiflu).  It has been sent to your designated pharmacy.  You will take one 75 mg capsule orally twice a day for the next 5 days. You need to take this medicine with food, if you have nausea or feel unable to eat please let us know   ANYONE WHO HAS FLU SYMPTOMS SHOULD: Stay home. The flu is highly contagious and going out or to work exposes others! Be sure to drink plenty of fluids. Water is fine as well as fruit juices, sodas and electrolyte beverages. You may want to stay away from caffeine or alcohol. If you are nauseated, try taking small sips of liquids. How do you know if you are  getting enough fluid? Your urine should be a pale yellow or almost colorless. Get rest. Taking a steamy shower or using a humidifier may help nasal congestion and ease sore throat pain. Using a saline nasal spray works much the same way. Cough drops, hard candies and sore throat lozenges may ease your cough. Line up a caregiver. Have someone check on you regularly.   GET HELP RIGHT AWAY IF: You cannot keep down liquids or your medications. You become short of breath Your fell like you are going to pass out or loose consciousness. Your symptoms persist after you have completed your treatment plan MAKE SURE YOU  Understand these instructions. Will watch your condition. Will get help right away if you are not doing well or get worse.  Your e-visit answers were reviewed by a board certified advanced clinical practitioner to complete your personal care plan.  Depending on the condition, your plan could have included both over the counter or prescription medications.  If there is a problem please reply  once you have received a response from your provider.  Your safety is important to [place].  If you have drug allergies check your prescription carefully.    You can use MyChart to ask questions about today's visit, request a non-urgent call back, or ask for a work or school excuse for 24 hours related to this e-Visit. If it has been greater than 24 hours you will need to follow  up with your provider, or enter a new e-Visit to address those concerns.  You will get an e-mail in the next two days asking about your experience.  I hope that your e-visit has been valuable and will speed your recovery. Thank you for using e-visits.   I spent approximately 5 minutes reviewing the patient's history, current symptoms and coordinating their care today.

## 2023-08-01 ENCOUNTER — Ambulatory Visit: Payer: BC Managed Care – PPO | Admitting: Family Medicine

## 2023-08-01 ENCOUNTER — Encounter: Payer: Self-pay | Admitting: Family Medicine

## 2023-08-01 VITALS — BP 108/62 | HR 77 | Temp 98.0°F | Resp 18 | Ht 69.0 in | Wt 170.2 lb

## 2023-08-01 DIAGNOSIS — F909 Attention-deficit hyperactivity disorder, unspecified type: Secondary | ICD-10-CM

## 2023-08-01 DIAGNOSIS — J111 Influenza due to unidentified influenza virus with other respiratory manifestations: Secondary | ICD-10-CM | POA: Insufficient documentation

## 2023-08-01 MED ORDER — AMPHETAMINE-DEXTROAMPHET ER 30 MG PO CP24: 30.0000 mg | ORAL_CAPSULE | ORAL | 0 refills | Status: DC

## 2023-08-01 NOTE — Assessment & Plan Note (Signed)
Chronic issue. Adequately controlled. Continue adderall XR 30 mg daily. Controlled substance database reviewed.

## 2023-08-01 NOTE — Progress Notes (Signed)
  Marikay Alar, MD Phone: [phone]  Cory Harding is a 23 y.o. male who presents today for f/u.  ADHD Medication: adderall XR 30 mg daily, recently increased dose Effectiveness: yes Palpitations: no Sleep difficulty: no Appetite suppression: no  Patient reports he just got over influenza.  He notes he has pretty much fully recovered at this time.  Reports he took Tamiflu.  Social History   Tobacco Use  Smoking Status Never  Smokeless Tobacco Never    Current Outpatient Medications on File Prior to Visit  Medication Sig Dispense Refill   albuterol (VENTOLIN HFA) 108 (90 Base) MCG/ACT inhaler Inhale 2 puffs into the lungs every 6 (six) hours as needed for wheezing or shortness of breath. 18 g 0   busPIRone (BUSPAR) 10 MG tablet TAKE 1 TABLET TWICE A DAY 180 tablet 3   cetirizine (ZYRTEC) 10 MG tablet Take 10 mg by mouth daily.     fluticasone (FLONASE) 50 MCG/ACT nasal spray Place 2 sprays into both nostrils daily. 16 g 6   omeprazole (PRILOSEC) 20 MG capsule TAKE 1 CAPSULE DAILY AT LEAST 30 MINUTES BEFORE BREAKFAST 90 capsule 3   triamcinolone cream (KENALOG) 0.1 % Apply 1 Application topically 2 (two) times daily. 30 g 0   No current facility-administered medications on file prior to visit.     ROS see history of present illness  Objective  Physical Exam Vitals:   08/01/23 1012  BP: 108/62  Pulse: 77  Resp: 18  Temp: 98 F (36.7 C)  SpO2: 99%    BP Readings from Last 3 Encounters:  08/01/23 108/62  07/11/23 124/76  07/11/23 110/60   Wt Readings from Last 3 Encounters:  08/01/23 170 lb 4 oz (77.2 kg)  07/11/23 165 lb 3.2 oz (74.9 kg)  03/27/23 161 lb 6.4 oz (73.2 kg)    Physical Exam Constitutional:      General: He is not in acute distress.    Appearance: He is not diaphoretic.  Cardiovascular:     Rate and Rhythm: Normal rate and regular rhythm.     Heart sounds: Normal heart sounds.  Pulmonary:     Effort: Pulmonary effort is normal.   Skin:    General: Skin is warm and dry.  Neurological:     Mental Status: He is alert.      Assessment/Plan: Please see individual problem list.  Attention deficit hyperactivity disorder (ADHD), unspecified ADHD type Assessment & Plan: Chronic issue. Adequately controlled. Continue adderall XR 30 mg daily. Controlled substance database reviewed.   Orders: -     Amphetamine-Dextroamphet ER; Take 1 capsule (30 mg total) by mouth every morning.  Dispense: 30 capsule; Refill: 0 -     Amphetamine-Dextroamphet ER; Take 1 capsule (30 mg total) by mouth every morning.  Dispense: 30 capsule; Refill: 0 -     Amphetamine-Dextroamphet ER; Take 1 capsule (30 mg total) by mouth every morning.  Dispense: 30 capsule; Refill: 0  Influenza Assessment & Plan: Symptoms have resolved.  He will monitor for any recurrent symptoms.     Return for as scheduled.   Marikay Alar, MD [facility]

## 2023-08-01 NOTE — Assessment & Plan Note (Signed)
Symptoms have resolved.  He will monitor for any recurrent symptoms.

## 2023-08-11 DIAGNOSIS — F411 Generalized anxiety disorder: Secondary | ICD-10-CM | POA: Diagnosis not present

## 2023-09-04 ENCOUNTER — Ambulatory Visit: Payer: BC Managed Care – PPO | Admitting: Dermatology

## 2023-10-10 ENCOUNTER — Ambulatory Visit: Payer: BC Managed Care – PPO | Admitting: Nurse Practitioner

## 2023-10-10 ENCOUNTER — Encounter: Payer: Self-pay | Admitting: Nurse Practitioner

## 2023-10-10 VITALS — BP 100/60 | HR 70 | Temp 97.9°F | Ht 69.0 in | Wt 173.2 lb

## 2023-10-10 DIAGNOSIS — F909 Attention-deficit hyperactivity disorder, unspecified type: Secondary | ICD-10-CM | POA: Diagnosis not present

## 2023-10-10 DIAGNOSIS — F419 Anxiety disorder, unspecified: Secondary | ICD-10-CM

## 2023-10-10 DIAGNOSIS — E663 Overweight: Secondary | ICD-10-CM

## 2023-10-10 MED ORDER — AMPHETAMINE-DEXTROAMPHET ER 30 MG PO CP24: 30.0000 mg | ORAL_CAPSULE | ORAL | 0 refills | Status: DC

## 2023-10-10 NOTE — Assessment & Plan Note (Signed)
 Transitioned from Vyvanse to Adderall due to concentration issues. Reports improved sleep, no palpitations, and effective symptom control with Adderall. Prefers Adderall over Vyvanse. Continue Adderall 30 mg extended release once daily. Prescribe three months of Adderall for May, June, and July. Schedule follow-up appointment for end of July. PDMP reviewed. Non-Opioid Controlled Substance Agreement signed today.

## 2023-10-10 NOTE — Assessment & Plan Note (Signed)
 Significant weight loss followed by recent gain. Binge eating possibly due to inadequate intake. Discussed importance of consistent caloric intake and caloric deficit for weight loss. Recommend a low carb, high protein diet. Advise on maintaining a consistent caloric intake throughout the week. Encourage regular exercise.

## 2023-10-10 NOTE — Progress Notes (Signed)
 Bethanie Dicker, NP-C Phone: [phone]  Cory Harding is a 23 y.o. male who presents today for transfer of care.   Discussed the use of AI scribe software for clinical note transcription with the patient, who gave verbal consent to proceed.  History of Present Illness   Cory Harding is a 23 year old male with ADHD who presents for transfer of care.  He is currently taking Adderall 30 mg extended release once daily for ADHD. He initially transitioned from Vyvanse 70 mg to Adderall 25 mg, which was effective. However, after increasing to 30 mg, he did not notice much difference and returned to 25 mg, but experienced some loss of concentration. He then increased back to 30 mg, which he feels is effective, though he is still 'getting a good read' on it. He reports improved sleep compared to Vyvanse, which caused insomnia, and no significant appetite suppression or palpitations.  He is also taking Buspar 10 mg once daily for anxiety, although it is prescribed for twice daily use. He feels that once daily is sufficient.  He has experienced significant weight changes, having gone from 230 lbs to a low of 158 lbs over the past year while following a keto and carnivore diet. Recently, he has gained weight, currently weighing 173 lbs, with a 15 lb increase over the past three months. He attributes some of this to transitioning out of the strict diet and experiencing episodes of binge eating, particularly when not adhering to a structured meal plan. He is aiming to maintain a weight of around 165 lbs. He acknowledges the challenge of maintaining consistent eating habits throughout the week.      Social History   Tobacco Use  Smoking Status Never  Smokeless Tobacco Never    Current Outpatient Medications on File Prior to Visit  Medication Sig Dispense Refill   albuterol (VENTOLIN HFA) 108 (90 Base) MCG/ACT inhaler Inhale 2 puffs into the lungs every 6 (six) hours as needed for wheezing or shortness  of breath. 18 g 0   busPIRone (BUSPAR) 10 MG tablet TAKE 1 TABLET TWICE A DAY 180 tablet 3   cetirizine (ZYRTEC) 10 MG tablet Take 10 mg by mouth daily.     fluticasone (FLONASE) 50 MCG/ACT nasal spray Place 2 sprays into both nostrils daily. 16 g 6   omeprazole (PRILOSEC) 20 MG capsule TAKE 1 CAPSULE DAILY AT LEAST 30 MINUTES BEFORE BREAKFAST 90 capsule 3   No current facility-administered medications on file prior to visit.    ROS see history of present illness  Objective  Physical Exam Vitals:   10/10/23 0857  BP: 100/60  Pulse: 70  Temp: 97.9 F (36.6 C)  SpO2: 99%    BP Readings from Last 3 Encounters:  10/10/23 100/60  08/01/23 108/62  07/11/23 124/76   Wt Readings from Last 3 Encounters:  10/10/23 173 lb 3.2 oz (78.6 kg)  08/01/23 170 lb 4 oz (77.2 kg)  07/11/23 165 lb 3.2 oz (74.9 kg)    Physical Exam Constitutional:      General: He is not in acute distress.    Appearance: Normal appearance.  HENT:     Head: Normocephalic.  Cardiovascular:     Rate and Rhythm: Normal rate and regular rhythm.     Heart sounds: Normal heart sounds.  Pulmonary:     Effort: Pulmonary effort is normal.     Breath sounds: Normal breath sounds.  Skin:    General: Skin is warm and dry.  Neurological:     General: No focal deficit present.     Mental Status: He is alert.  Psychiatric:        Mood and Affect: Mood normal.        Behavior: Behavior normal.     Assessment/Plan: Please see individual problem list.  Attention deficit hyperactivity disorder (ADHD), unspecified ADHD type Assessment & Plan: Transitioned from Vyvanse to Adderall due to concentration issues. Reports improved sleep, no palpitations, and effective symptom control with Adderall. Prefers Adderall over Vyvanse. Continue Adderall 30 mg extended release once daily. Prescribe three months of Adderall for May, June, and July. Schedule follow-up appointment for end of July. PDMP reviewed. Non-Opioid  Controlled Substance Agreement signed today.   Orders: -     Amphetamine-Dextroamphet ER; Take 1 capsule (30 mg total) by mouth every morning.  Dispense: 30 capsule; Refill: 0 -     Amphetamine-Dextroamphet ER; Take 1 capsule (30 mg total) by mouth every morning.  Dispense: 30 capsule; Refill: 0 -     Amphetamine-Dextroamphet ER; Take 1 capsule (30 mg total) by mouth every morning.  Dispense: 30 capsule; Refill: 0  Anxiety Assessment & Plan: Buspar 10 mg once daily effectively manages symptoms despite prescribed twice daily regimen. GAD- 4 today. Continue Buspar 10 mg once daily as tolerated.   Overweight (BMI 25.0-29.9) Assessment & Plan: Significant weight loss followed by recent gain. Binge eating possibly due to inadequate intake. Discussed importance of consistent caloric intake and caloric deficit for weight loss. Recommend a low carb, high protein diet. Advise on maintaining a consistent caloric intake throughout the week. Encourage regular exercise.      Return in about 16 weeks (around 01/30/2024) for Follow up.   Bethanie Dicker, NP-C [facility]

## 2023-10-10 NOTE — Assessment & Plan Note (Signed)
 Buspar 10 mg once daily effectively manages symptoms despite prescribed twice daily regimen. GAD- 4 today. Continue Buspar 10 mg once daily as tolerated.

## 2023-10-26 ENCOUNTER — Other Ambulatory Visit: Payer: Self-pay

## 2023-10-26 MED ORDER — BUSPIRONE HCL 10 MG PO TABS: 10.0000 mg | ORAL_TABLET | Freq: Two times a day (BID) | ORAL | 0 refills | Status: AC

## 2024-01-31 ENCOUNTER — Encounter: Payer: Self-pay | Admitting: Nurse Practitioner

## 2024-01-31 ENCOUNTER — Ambulatory Visit: Admitting: Nurse Practitioner

## 2024-01-31 VITALS — BP 122/82 | HR 74 | Temp 98.1°F | Ht 69.0 in | Wt 178.0 lb

## 2024-01-31 DIAGNOSIS — F909 Attention-deficit hyperactivity disorder, unspecified type: Secondary | ICD-10-CM | POA: Diagnosis not present

## 2024-01-31 DIAGNOSIS — E663 Overweight: Secondary | ICD-10-CM | POA: Diagnosis not present

## 2024-01-31 MED ORDER — AMPHETAMINE-DEXTROAMPHET ER 30 MG PO CP24: 30.0000 mg | ORAL_CAPSULE | ORAL | 0 refills | Status: DC

## 2024-01-31 MED ORDER — AMPHETAMINE-DEXTROAMPHETAMINE 10 MG PO TABS: 10.0000 mg | ORAL_TABLET | Freq: Every evening | ORAL | 0 refills | Status: DC

## 2024-01-31 NOTE — Progress Notes (Signed)
 Leron Glance, NP-C Phone: [phone]  Cory Harding is a 23 y.o. male who presents today for follow up.   Discussed the use of AI scribe software for clinical note transcription with the patient, who gave verbal consent to proceed.  History of Present Illness   Cory Harding is a 23 year old male who presents with concerns about the effectiveness of his current ADHD medication regimen.  He is currently taking Adderall XR 30 mg once daily for ADHD. The medication's effect diminishes over time, impacting his motivation and task completion. He previously used Vyvanse  70 mg for about ten years but switched to Adderall last year. He has experimented with dosages between 25 mg and 30 mg. No sleep disturbances with Adderall, unlike with Vyvanse , which caused significant sleep issues.  He has experienced significant weight fluctuations over the past year, previously weighing 230 pounds, dropping to 158 pounds, and currently at 177 pounds. He attributes these changes to dietary habits, including breaking a keto diet and experiencing increased cravings. He is concerned about not providing his body with the right nutrients while maintaining a low-carb diet. Recent life stressors, including a wedding, a new house, and a puppy, have impacted his eating habits.  He tries to maintain a low-carb, high-protein diet, aiming for close to 100 grams of protein per day, but finds it challenging to achieve. He has attempted fasting but finds it leads to overeating during meals. He is considering meal prepping to help with portion control and ensure a balanced diet.  The patient reports no heart palpitations and no problems with sleep.      Social History   Tobacco Use  Smoking Status Never  Smokeless Tobacco Never    Current Outpatient Medications on File Prior to Visit  Medication Sig Dispense Refill   albuterol  (VENTOLIN  HFA) 108 (90 Base) MCG/ACT inhaler Inhale 2 puffs into the lungs every 6 (six) hours  as needed for wheezing or shortness of breath. 18 g 0   amphetamine -dextroamphetamine  (ADDERALL XR) 30 MG 24 hr capsule Take 1 capsule (30 mg total) by mouth every morning. 30 capsule 0   amphetamine -dextroamphetamine  (ADDERALL XR) 30 MG 24 hr capsule Take 1 capsule (30 mg total) by mouth every morning. 30 capsule 0   busPIRone  (BUSPAR ) 10 MG tablet Take 1 tablet (10 mg total) by mouth 2 (two) times daily. 180 tablet 0   cetirizine (ZYRTEC) 10 MG tablet Take 10 mg by mouth daily.     fluticasone  (FLONASE ) 50 MCG/ACT nasal spray Place 2 sprays into both nostrils daily. 16 g 6   omeprazole  (PRILOSEC) 20 MG capsule TAKE 1 CAPSULE DAILY AT LEAST 30 MINUTES BEFORE BREAKFAST 90 capsule 3   No current facility-administered medications on file prior to visit.     ROS see history of present illness  Objective  Physical Exam Vitals:   01/31/24 0817  BP: 122/82  Pulse: 74  Temp: 98.1 F (36.7 C)  SpO2: 99%    BP Readings from Last 3 Encounters:  01/31/24 122/82  10/10/23 100/60  08/01/23 108/62   Wt Readings from Last 3 Encounters:  01/31/24 178 lb (80.7 kg)  10/10/23 173 lb 3.2 oz (78.6 kg)  08/01/23 170 lb 4 oz (77.2 kg)    Physical Exam Constitutional:      General: He is not in acute distress.    Appearance: Normal appearance.  HENT:     Head: Normocephalic.  Cardiovascular:     Rate and Rhythm: Normal rate and  regular rhythm.     Heart sounds: Normal heart sounds.  Pulmonary:     Effort: Pulmonary effort is normal.     Breath sounds: Normal breath sounds.  Skin:    General: Skin is warm and dry.  Neurological:     General: No focal deficit present.     Mental Status: He is alert.  Psychiatric:        Mood and Affect: Mood normal.        Behavior: Behavior normal.      Assessment/Plan: Please see individual problem list.  Attention deficit hyperactivity disorder (ADHD), unspecified ADHD type Assessment & Plan: ADHD is managed with Adderall XR 30 mg daily, but  he reports a diminishing effect. Add Adderall 10 mg in the afternoon or early evening. Monitor for potential side effects, including insomnia and heart palpitations. Schedule a follow-up in one month to assess effectiveness. PDMP reviewed.   Orders: -     Amphetamine -Dextroamphet ER; Take 1 capsule (30 mg total) by mouth every morning.  Dispense: 30 capsule; Refill: 0 -     Amphetamine -Dextroamphetamine ; Take 1 tablet (10 mg total) by mouth every evening.  Dispense: 30 tablet; Refill: 0  Overweight (BMI 25.0-29.9) Assessment & Plan: His weight increased from 158 lbs to 177 lbs, with difficulty managing cravings and stress. Discussed dietary strategies, emphasizing low carb, high protein intake. Increase protein intake while maintaining a low-carb diet. Consider meal prepping to manage portion sizes and ensure balanced meals. Avoid fasting if it leads to overeating during meals. Encourage regular exercise.        Return in about 4 weeks (around 02/28/2024) for Follow up.   Leron Glance, NP-C  Primary Care - [facility]

## 2024-02-07 ENCOUNTER — Encounter: Payer: Self-pay | Admitting: Nurse Practitioner

## 2024-02-07 NOTE — Assessment & Plan Note (Signed)
 ADHD is managed with Adderall XR 30 mg daily, but he reports a diminishing effect. Add Adderall 10 mg in the afternoon or early evening. Monitor for potential side effects, including insomnia and heart palpitations. Schedule a follow-up in one month to assess effectiveness. PDMP reviewed.

## 2024-02-07 NOTE — Assessment & Plan Note (Signed)
 His weight increased from 158 lbs to 177 lbs, with difficulty managing cravings and stress. Discussed dietary strategies, emphasizing low carb, high protein intake. Increase protein intake while maintaining a low-carb diet. Consider meal prepping to manage portion sizes and ensure balanced meals. Avoid fasting if it leads to overeating during meals. Encourage regular exercise.

## 2024-02-28 ENCOUNTER — Telehealth: Admitting: Nurse Practitioner

## 2024-02-28 ENCOUNTER — Telehealth: Payer: Self-pay

## 2024-02-28 ENCOUNTER — Encounter: Payer: Self-pay | Admitting: Nurse Practitioner

## 2024-02-28 DIAGNOSIS — F909 Attention-deficit hyperactivity disorder, unspecified type: Secondary | ICD-10-CM

## 2024-02-28 MED ORDER — AMPHETAMINE-DEXTROAMPHETAMINE 10 MG PO TABS: 10.0000 mg | ORAL_TABLET | Freq: Every evening | ORAL | 0 refills | Status: DC

## 2024-02-28 NOTE — Progress Notes (Signed)
 MyChart Video Visit    Virtual Visit via Video Note   This visit type was conducted because this format is felt to be most appropriate for this patient at this time. Physical exam was limited by quality of the video and audio technology used for the visit. CMA was able to get the patient set up on a video visit.  Patient location: Home. Patient and provider in visit Provider location: Office  I discussed the limitations of evaluation and management by telemedicine and the availability of in person appointments. The patient expressed understanding and agreed to proceed.  Visit Date: 02/28/2024  Today's healthcare provider: Leron Glance, NP     Subjective:    Patient ID: Cory Harding, male    DOB: 2001-05-08, 23 y.o.   MRN: [id number]  Chief Complaint  Patient presents with   ADHD    4 Week follow up     HPI  Discussed the use of AI scribe software for clinical note transcription with the patient, who gave verbal consent to proceed.  History of Present Illness   Cory Harding is a 23 year old male with ADHD who presents for a one month follow-up regarding his Adderall medication.  He has been on Adderall 30 mg XR, but it was wearing off in the afternoons and early evenings. To address this, a dose of Adderall 10 mg was added in the afternoon. He describes the adjustment as 'not bad' but is still acclimating to it and is unsure of its effectiveness.  He has been taking the additional dose around 3 to 4 PM but has not been consistent, sometimes forgetting to take it.  No heart palpitations or trouble with sleep since starting the additional dose.      Past Medical History:  Diagnosis Date   ADHD (attention deficit hyperactivity disorder)    Asthma    GERD (gastroesophageal reflux disease)     No past surgical history on file.  Family History  Problem Relation Age of Onset   Hypertension Maternal Grandfather    Heart disease Maternal Grandfather    Heart  disease Paternal Grandfather    Sudden death Paternal Grandfather    Mental illness Paternal Grandfather    Alcoholism Paternal Grandfather     Social History   Socioeconomic History   Marital status: Single    Spouse name: Not on file   Number of children: Not on file   Years of education: Not on file   Highest education level: Not on file  Occupational History   Occupation: Consulting civil engineer  Tobacco Use   Smoking status: Never   Smokeless tobacco: Never  Vaping Use   Vaping status: Never Used  Substance and Sexual Activity   Alcohol use: No    Alcohol/week: 0.0 standard drinks of alcohol   Drug use: No   Sexual activity: Not on file  Other Topics Concern   Not on file  Social History Narrative   Not on file   Social Drivers of Health   Financial Resource Strain: Not on file  Food Insecurity: Not on file  Transportation Needs: Not on file  Physical Activity: Not on file  Stress: Not on file  Social Connections: Not on file  Intimate Partner Violence: Not on file    Outpatient Medications Prior to Visit  Medication Sig Dispense Refill   albuterol  (VENTOLIN  HFA) 108 (90 Base) MCG/ACT inhaler Inhale 2 puffs into the lungs every 6 (six) hours as needed for wheezing or  shortness of breath. 18 g 0   amphetamine -dextroamphetamine  (ADDERALL XR) 30 MG 24 hr capsule Take 1 capsule (30 mg total) by mouth every morning. 30 capsule 0   amphetamine -dextroamphetamine  (ADDERALL XR) 30 MG 24 hr capsule Take 1 capsule (30 mg total) by mouth every morning. 30 capsule 0   amphetamine -dextroamphetamine  (ADDERALL XR) 30 MG 24 hr capsule Take 1 capsule (30 mg total) by mouth every morning. 30 capsule 0   busPIRone  (BUSPAR ) 10 MG tablet Take 1 tablet (10 mg total) by mouth 2 (two) times daily. 180 tablet 0   cetirizine (ZYRTEC) 10 MG tablet Take 10 mg by mouth daily.     fluticasone  (FLONASE ) 50 MCG/ACT nasal spray Place 2 sprays into both nostrils daily. 16 g 6   omeprazole  (PRILOSEC) 20 MG  capsule TAKE 1 CAPSULE DAILY AT LEAST 30 MINUTES BEFORE BREAKFAST 90 capsule 3   amphetamine -dextroamphetamine  (ADDERALL) 10 MG tablet Take 1 tablet (10 mg total) by mouth every evening. 30 tablet 0   No facility-administered medications prior to visit.    No Known Allergies  ROS See HPI    Objective:    Physical Exam  Ht 5' 9 (1.753 m)   Wt 170 lb (77.1 kg)   BMI 25.10 kg/m  Wt Readings from Last 3 Encounters:  02/28/24 170 lb (77.1 kg)  01/31/24 178 lb (80.7 kg)  10/10/23 173 lb 3.2 oz (78.6 kg)   GENERAL: alert, oriented, appears well and in no acute distress   HEENT: atraumatic, conjunttiva clear, no obvious abnormalities on inspection of external nose and ears   NECK: normal movements of the head and neck   LUNGS: on inspection no signs of respiratory distress, breathing rate appears normal, no obvious gross SOB, gasping or wheezing   CV: no obvious cyanosis   MS: moves all visible extremities without noticeable abnormality   PSYCH/NEURO: pleasant and cooperative, no obvious depression or anxiety, speech and thought processing grossly intact    Assessment & Plan:   Problem List Items Addressed This Visit     Attention deficit hyperactivity disorder (ADHD) (Chronic)   Managed with Adderall XR 30 mg daily in the morning and Adderall 10 mg in the afternoon. Adjustment is ongoing with no adverse effects reported. Continue Adderall XR 30 mg in the morning and 10 mg in the afternoon. Encourage consistent medication adherence. Follow up in two months to reassess treatment efficacy and adherence. PDMP reviewed.       Relevant Medications   amphetamine -dextroamphetamine  (ADDERALL) 10 MG tablet   amphetamine -dextroamphetamine  (ADDERALL) 10 MG tablet (Start on 03/29/2024)    I am having [name] start on amphetamine -dextroamphetamine . I am also having him maintain his cetirizine, fluticasone , albuterol , omeprazole , amphetamine -dextroamphetamine ,  amphetamine -dextroamphetamine , busPIRone , amphetamine -dextroamphetamine , and amphetamine -dextroamphetamine .  Meds ordered this encounter  Medications   amphetamine -dextroamphetamine  (ADDERALL) 10 MG tablet    Sig: Take 1 tablet (10 mg total) by mouth every evening.    Dispense:  30 tablet    Refill:  0    Supervising Provider:   TULLO, TERESA L [[id number]]   amphetamine -dextroamphetamine  (ADDERALL) 10 MG tablet    Sig: Take 1 tablet (10 mg total) by mouth every evening.    Dispense:  30 tablet    Refill:  0    Supervising Provider:   MARYLYNN VERNEITA CROME [[id number]]    I discussed the assessment and treatment plan with the patient. The patient was provided an opportunity to ask questions and all were answered. The patient agreed with the  plan and demonstrated an understanding of the instructions.   The patient was advised to call back or seek an in-person evaluation if the symptoms worsen or if the condition fails to improve as anticipated.   Leron Glance, NP [facility] at [facility] [phone] (phone) [phone] (fax)  [facility]

## 2024-02-28 NOTE — Telephone Encounter (Signed)
 Left message to return call to our office.  Trying to get pt set up for virtual visit with [facility].

## 2024-03-07 ENCOUNTER — Encounter: Payer: Self-pay | Admitting: Nurse Practitioner

## 2024-03-07 NOTE — Assessment & Plan Note (Addendum)
 Managed with Adderall XR 30 mg daily in the morning and Adderall 10 mg in the afternoon. Adjustment is ongoing with no adverse effects reported. Continue Adderall XR 30 mg in the morning and 10 mg in the afternoon. Encourage consistent medication adherence. Follow up in two months to reassess treatment efficacy and adherence. PDMP reviewed.

## 2024-03-15 ENCOUNTER — Encounter: Payer: Self-pay | Admitting: Nurse Practitioner

## 2024-03-19 ENCOUNTER — Other Ambulatory Visit: Payer: Self-pay | Admitting: Nurse Practitioner

## 2024-03-19 DIAGNOSIS — F909 Attention-deficit hyperactivity disorder, unspecified type: Secondary | ICD-10-CM

## 2024-03-19 MED ORDER — AMPHETAMINE-DEXTROAMPHET ER 30 MG PO CP24: 30.0000 mg | ORAL_CAPSULE | ORAL | 0 refills | Status: DC

## 2024-03-19 MED ORDER — AMPHETAMINE-DEXTROAMPHET ER 30 MG PO CP24
30.0000 mg | ORAL_CAPSULE | ORAL | 0 refills | Status: AC
Start: 2024-04-18 — End: ?

## 2024-04-15 ENCOUNTER — Encounter: Payer: Self-pay | Admitting: Nurse Practitioner

## 2024-04-15 NOTE — Telephone Encounter (Signed)
 Pt wanting to change appt to virtual.

## 2024-04-19 ENCOUNTER — Telehealth: Admitting: Nurse Practitioner

## 2024-04-19 ENCOUNTER — Encounter: Payer: Self-pay | Admitting: Nurse Practitioner

## 2024-04-19 DIAGNOSIS — F909 Attention-deficit hyperactivity disorder, unspecified type: Secondary | ICD-10-CM | POA: Diagnosis not present

## 2024-04-19 MED ORDER — AMPHETAMINE-DEXTROAMPHET ER 30 MG PO CP24: 30.0000 mg | ORAL_CAPSULE | ORAL | 0 refills | Status: DC

## 2024-04-19 MED ORDER — AMPHETAMINE-DEXTROAMPHETAMINE 10 MG PO TABS: 10.0000 mg | ORAL_TABLET | Freq: Every evening | ORAL | 0 refills | Status: DC

## 2024-04-19 NOTE — Assessment & Plan Note (Signed)
 ADHD is well-managed with current medication and no adverse effects are reported. Continue Adderall XR 30 mg in the morning and Adderall 10 mg in the afternoon at 2 PM. Prescribe a three-month supply of both medications. PDMP reviewed. Advise to report any changes in symptoms or concerns. Follow up in three months unless needed sooner.

## 2024-04-19 NOTE — Progress Notes (Signed)
 MyChart Video Visit    Virtual Visit via Video Note   This visit type was conducted because this format is felt to be most appropriate for this patient at this time. Physical exam was limited by quality of the video and audio technology used for the visit. CMA was able to get the patient set up on a video visit.  Patient location: Work. Patient and provider in visit Provider location: Office  I discussed the limitations of evaluation and management by telemedicine and the availability of in person appointments. The patient expressed understanding and agreed to proceed.  Visit Date: 04/19/2024  Today's healthcare provider: Leron Glance, NP     Subjective:    Patient ID: Cory Harding, male    DOB: 2001/01/22, 23 y.o.   MRN: [id number]  Chief Complaint  Patient presents with   Medication Refill    HPI  Discussed the use of AI scribe software for clinical note transcription with the patient, who gave verbal consent to proceed.  History of Present Illness   Cory Harding is a 23 year old male who presents for medication management of ADHD.  He is currently taking Adderall XR 30 mg in the morning and an additional 10 mg in the afternoon. He sometimes questions if his 'laziness' is due to the medication or personal habits.  Previously, he experienced the medication wearing off early, which led to the addition of the afternoon dose. He now takes the afternoon dose consistently around 2 PM, which he finds effective without causing sleep disturbances.  No heart palpitations, sleep problems, or appetite issues. He notes that taking the medication later in the afternoon, around 3 PM, can result in waking up at 2 AM, so he avoids doing so.      Past Medical History:  Diagnosis Date   ADHD (attention deficit hyperactivity disorder)    Asthma    GERD (gastroesophageal reflux disease)     No past surgical history on file.  Family History  Problem Relation Age of Onset    Hypertension Maternal Grandfather    Heart disease Maternal Grandfather    Heart disease Paternal Grandfather    Sudden death Paternal Grandfather    Mental illness Paternal Grandfather    Alcoholism Paternal Grandfather     Social History   Socioeconomic History   Marital status: Single    Spouse name: Not on file   Number of children: Not on file   Years of education: Not on file   Highest education level: Not on file  Occupational History   Occupation: Consulting civil engineer  Tobacco Use   Smoking status: Never   Smokeless tobacco: Never  Vaping Use   Vaping status: Never Used  Substance and Sexual Activity   Alcohol use: No    Alcohol/week: 0.0 standard drinks of alcohol   Drug use: No   Sexual activity: Not on file  Other Topics Concern   Not on file  Social History Narrative   Not on file   Social Drivers of Health   Financial Resource Strain: Not on file  Food Insecurity: Not on file  Transportation Needs: Not on file  Physical Activity: Not on file  Stress: Not on file  Social Connections: Not on file  Intimate Partner Violence: Not on file    Outpatient Medications Prior to Visit  Medication Sig Dispense Refill   albuterol  (VENTOLIN  HFA) 108 (90 Base) MCG/ACT inhaler Inhale 2 puffs into the lungs every 6 (six) hours as needed  for wheezing or shortness of breath. 18 g 0   busPIRone  (BUSPAR ) 10 MG tablet Take 1 tablet (10 mg total) by mouth 2 (two) times daily. 180 tablet 0   cetirizine (ZYRTEC) 10 MG tablet Take 10 mg by mouth daily.     fluticasone  (FLONASE ) 50 MCG/ACT nasal spray Place 2 sprays into both nostrils daily. 16 g 6   omeprazole  (PRILOSEC) 20 MG capsule TAKE 1 CAPSULE DAILY AT LEAST 30 MINUTES BEFORE BREAKFAST 90 capsule 3   amphetamine -dextroamphetamine  (ADDERALL XR) 30 MG 24 hr capsule Take 1 capsule (30 mg total) by mouth every morning. 30 capsule 0   amphetamine -dextroamphetamine  (ADDERALL XR) 30 MG 24 hr capsule Take 1 capsule (30 mg total) by mouth  every morning. 30 capsule 0   amphetamine -dextroamphetamine  (ADDERALL) 10 MG tablet Take 1 tablet (10 mg total) by mouth every evening. 30 tablet 0   amphetamine -dextroamphetamine  (ADDERALL) 10 MG tablet Take 1 tablet (10 mg total) by mouth every evening. 30 tablet 0   No facility-administered medications prior to visit.    No Known Allergies  ROS See HPI    Objective:    Physical Exam  Ht 5' 9 (1.753 m)   Wt 172 lb (78 kg)   BMI 25.40 kg/m  Wt Readings from Last 3 Encounters:  04/19/24 172 lb (78 kg)  02/28/24 170 lb (77.1 kg)  01/31/24 178 lb (80.7 kg)  GENERAL: alert, oriented, appears well and in no acute distress   HEENT: atraumatic, conjunttiva clear, no obvious abnormalities on inspection of external nose and ears   NECK: normal movements of the head and neck   LUNGS: on inspection no signs of respiratory distress, breathing rate appears normal, no obvious gross SOB, gasping or wheezing   CV: no obvious cyanosis   MS: moves all visible extremities without noticeable abnormality   PSYCH/NEURO: pleasant and cooperative, no obvious depression or anxiety, speech and thought processing grossly intact      Assessment & Plan:   Problem List Items Addressed This Visit     Attention deficit hyperactivity disorder (ADHD) (Chronic)   ADHD is well-managed with current medication and no adverse effects are reported. Continue Adderall XR 30 mg in the morning and Adderall 10 mg in the afternoon at 2 PM. Prescribe a three-month supply of both medications. PDMP reviewed. Advise to report any changes in symptoms or concerns. Follow up in three months unless needed sooner.       Relevant Medications   amphetamine -dextroamphetamine  (ADDERALL) 10 MG tablet   amphetamine -dextroamphetamine  (ADDERALL XR) 30 MG 24 hr capsule   amphetamine -dextroamphetamine  (ADDERALL XR) 30 MG 24 hr capsule (Start on 05/19/2024)   amphetamine -dextroamphetamine  (ADDERALL XR) 30 MG 24 hr capsule  (Start on 06/18/2024)   amphetamine -dextroamphetamine  (ADDERALL) 10 MG tablet (Start on 05/19/2024)   amphetamine -dextroamphetamine  (ADDERALL) 10 MG tablet (Start on 06/18/2024)    I am having Cory Harding start on amphetamine -dextroamphetamine , amphetamine -dextroamphetamine , amphetamine -dextroamphetamine , and amphetamine -dextroamphetamine . I am also having him maintain his cetirizine, fluticasone , albuterol , omeprazole , busPIRone , amphetamine -dextroamphetamine , and amphetamine -dextroamphetamine .  Meds ordered this encounter  Medications   amphetamine -dextroamphetamine  (ADDERALL) 10 MG tablet    Sig: Take 1 tablet (10 mg total) by mouth every evening.    Dispense:  30 tablet    Refill:  0    Supervising Provider:   TULLO, TERESA L [[id number]]   amphetamine -dextroamphetamine  (ADDERALL XR) 30 MG 24 hr capsule    Sig: Take 1 capsule (30 mg total) by mouth every morning.    Dispense:  30 capsule    Refill:  0    Supervising Provider:   TULLO, TERESA L [[id number]]   amphetamine -dextroamphetamine  (ADDERALL XR) 30 MG 24 hr capsule    Sig: Take 1 capsule (30 mg total) by mouth every morning.    Dispense:  30 capsule    Refill:  0    Supervising Provider:   TULLO, TERESA L [[id number]]   amphetamine -dextroamphetamine  (ADDERALL XR) 30 MG 24 hr capsule    Sig: Take 1 capsule (30 mg total) by mouth every morning.    Dispense:  30 capsule    Refill:  0    Supervising Provider:   TULLO, TERESA L [[id number]]   amphetamine -dextroamphetamine  (ADDERALL) 10 MG tablet    Sig: Take 1 tablet (10 mg total) by mouth every evening.    Dispense:  30 tablet    Refill:  0    Supervising Provider:   TULLO, TERESA L [[id number]]   amphetamine -dextroamphetamine  (ADDERALL) 10 MG tablet    Sig: Take 1 tablet (10 mg total) by mouth every evening.    Dispense:  30 tablet    Refill:  0    Supervising Provider:   MARYLYNN VERNEITA CROME [[id number]]   I discussed the assessment and treatment plan with the patient. The patient was provided an  opportunity to ask questions and all were answered. The patient agreed with the plan and demonstrated an understanding of the instructions.   The patient was advised to call back or seek an in-person evaluation if the symptoms worsen or if the condition fails to improve as anticipated.   Leron Glance, NP [facility] at [facility] [phone] (phone) [phone] (fax)  [facility]

## 2024-04-22 ENCOUNTER — Other Ambulatory Visit: Payer: Self-pay

## 2024-04-22 DIAGNOSIS — K219 Gastro-esophageal reflux disease without esophagitis: Secondary | ICD-10-CM

## 2024-04-22 MED ORDER — OMEPRAZOLE 20 MG PO CPDR: 20.0000 mg | DELAYED_RELEASE_CAPSULE | Freq: Every day | ORAL | 3 refills | Status: AC

## 2024-05-08 ENCOUNTER — Ambulatory Visit: Admitting: Nurse Practitioner

## 2024-07-22 ENCOUNTER — Other Ambulatory Visit: Payer: Self-pay | Admitting: *Deleted

## 2024-07-22 DIAGNOSIS — F909 Attention-deficit hyperactivity disorder, unspecified type: Secondary | ICD-10-CM

## 2024-07-22 MED ORDER — AMPHETAMINE-DEXTROAMPHETAMINE 10 MG PO TABS: 10.0000 mg | ORAL_TABLET | Freq: Every evening | ORAL | 0 refills | Status: AC

## 2024-07-22 MED ORDER — AMPHETAMINE-DEXTROAMPHET ER 30 MG PO CP24: 30.0000 mg | ORAL_CAPSULE | ORAL | 0 refills | Status: AC

## 2024-07-22 NOTE — Telephone Encounter (Signed)
 Copied from CRM #[id number]. Topic: Clinical - Medication Question >> Jul 22, 2024 10:25 AM Adelita E wrote: Reason for CRM: Due to patient's insurance he is needing the last refill of his amphetamine -dextroamphetamine  (ADDERALL XR) 30 MG 24 hr capsule  and amphetamine -dextroamphetamine  (ADDERALL) 10 MG tablet sent over to the [facility] at [address] in [place]. These prescriptions are currently at the [facility] across the street, he is just needing them sent to the [facility] pharmacy.

## 2024-07-30 ENCOUNTER — Encounter: Payer: Self-pay | Admitting: Nurse Practitioner

## 2024-08-23 ENCOUNTER — Ambulatory Visit: Admitting: Nurse Practitioner
